# Patient Record
Sex: Male | Born: 1945
Health system: Southern US, Community
[De-identification: ages and names within clinical notes are randomized; demographics above are authoritative.]

## PROBLEM LIST (undated history)

## (undated) DIAGNOSIS — E785 Hyperlipidemia, unspecified: Secondary | ICD-10-CM

## (undated) DIAGNOSIS — K219 Gastro-esophageal reflux disease without esophagitis: Secondary | ICD-10-CM

## (undated) DIAGNOSIS — H409 Unspecified glaucoma: Secondary | ICD-10-CM

## (undated) DIAGNOSIS — T7840XA Allergy, unspecified, initial encounter: Secondary | ICD-10-CM

## (undated) DIAGNOSIS — H269 Unspecified cataract: Secondary | ICD-10-CM

## (undated) DIAGNOSIS — M1990 Unspecified osteoarthritis, unspecified site: Secondary | ICD-10-CM

## (undated) DIAGNOSIS — K648 Other hemorrhoids: Secondary | ICD-10-CM

## (undated) DIAGNOSIS — D369 Benign neoplasm, unspecified site: Secondary | ICD-10-CM

## (undated) DIAGNOSIS — K2270 Barrett's esophagus without dysplasia: Secondary | ICD-10-CM

## (undated) HISTORY — DX: Unspecified cataract: H26.9

## (undated) HISTORY — DX: Allergy, unspecified, initial encounter: T78.40XA

## (undated) HISTORY — DX: Unspecified glaucoma: H40.9

## (undated) HISTORY — DX: Gastro-esophageal reflux disease without esophagitis: K21.9

## (undated) HISTORY — PX: COLONOSCOPY: SHX174

## (undated) HISTORY — DX: Unspecified osteoarthritis, unspecified site: M19.90

## (undated) HISTORY — DX: Benign neoplasm, unspecified site: D36.9

## (undated) HISTORY — PX: HEMORRHOID SURGERY: SHX153

## (undated) HISTORY — PX: EYE SURGERY: SHX253

## (undated) HISTORY — DX: Hyperlipidemia, unspecified: E78.5

## (undated) HISTORY — PX: UPPER GASTROINTESTINAL ENDOSCOPY: SHX188

## (undated) HISTORY — PX: CATARACT EXTRACTION: SUR2

## (undated) HISTORY — PX: KNEE ARTHROSCOPY: SUR90

## (undated) HISTORY — DX: Barrett's esophagus without dysplasia: K22.70

## (undated) HISTORY — DX: Other hemorrhoids: K64.8

## (undated) HISTORY — PX: VASECTOMY: SHX75

---

## 1999-02-28 ENCOUNTER — Ambulatory Visit (HOSPITAL_COMMUNITY): Admission: RE | Admit: 1999-02-28 | Discharge: 1999-02-28 | Payer: Self-pay | Admitting: *Deleted

## 2005-08-03 DIAGNOSIS — K648 Other hemorrhoids: Secondary | ICD-10-CM

## 2005-08-03 HISTORY — DX: Other hemorrhoids: K64.8

## 2005-08-29 ENCOUNTER — Ambulatory Visit (HOSPITAL_COMMUNITY): Admission: RE | Admit: 2005-08-29 | Discharge: 2005-08-29 | Payer: Self-pay | Admitting: *Deleted

## 2006-08-03 DIAGNOSIS — K2270 Barrett's esophagus without dysplasia: Secondary | ICD-10-CM

## 2006-08-03 HISTORY — DX: Barrett's esophagus without dysplasia: K22.70

## 2006-08-06 ENCOUNTER — Ambulatory Visit (HOSPITAL_COMMUNITY): Admission: RE | Admit: 2006-08-06 | Discharge: 2006-08-06 | Payer: Self-pay | Admitting: *Deleted

## 2006-10-07 ENCOUNTER — Encounter: Admission: RE | Admit: 2006-10-07 | Discharge: 2006-10-07 | Payer: Self-pay | Admitting: Specialist

## 2010-08-12 ENCOUNTER — Encounter (INDEPENDENT_AMBULATORY_CARE_PROVIDER_SITE_OTHER): Payer: Self-pay | Admitting: *Deleted

## 2010-08-18 NOTE — Letter (Signed)
Summary: New Patient letter  [facility]  [address] [place], [place] [address]   Phone: [phone]  Fax: [phone]       08/12/2010 MRN: [id number]  Stephen Conway [address] [place], [place]  [address]  Dear Stephen Conway,  Welcome to the Gastroenterology Division at [facility].    You are scheduled to see Dr.  Russella Dar on 09-19-10 at 2:30P.M. on the 3rd floor at [facility], [address]  We ask that you try to arrive at our office 15 minutes prior to your appointment time to allow for check-in.  We would like you to complete the enclosed self-administered evaluation form prior to your visit and bring it with you on the day of your appointment.  We will review it with you.  Also, please bring a complete list of all your medications or, if you prefer, bring the medication bottles and we will list them.  Please bring your insurance card so that we may make a copy of it.  If your insurance requires a referral to see a specialist, please bring your referral form from your primary care physician.  Co-payments are due at the time of your visit and may be paid by cash, check or credit card.     Your office visit will consist of a consult with your physician (includes a physical exam), any laboratory testing he/she may order, scheduling of any necessary diagnostic testing (e.g. x-ray, ultrasound, CT-scan), and scheduling of a procedure (e.g. Endoscopy, Colonoscopy) if required.  Please allow enough time on your schedule to allow for any/all of these possibilities.    If you cannot keep your appointment, please call [phone] to cancel or reschedule prior to your appointment date.  This allows [place] the opportunity to schedule an appointment for another patient in need of care.  If you do not cancel or reschedule by 5 p.m. the business day prior to your appointment date, you will be charged a $50.00 late cancellation/no-show fee.    Thank you for  choosing [facility] for your medical needs.  We appreciate the opportunity to care for you.  Please visit [place] at our website  to learn more about our practice.                     Sincerely,                                                             The Gastroenterology Division

## 2010-09-19 ENCOUNTER — Ambulatory Visit: Payer: Self-pay | Admitting: Gastroenterology

## 2010-09-26 ENCOUNTER — Encounter: Payer: Self-pay | Admitting: Gastroenterology

## 2010-09-26 ENCOUNTER — Ambulatory Visit (INDEPENDENT_AMBULATORY_CARE_PROVIDER_SITE_OTHER): Payer: Medicare Other | Admitting: Gastroenterology

## 2010-09-26 DIAGNOSIS — Z860100 Personal history of colon polyps, unspecified: Secondary | ICD-10-CM | POA: Insufficient documentation

## 2010-09-26 DIAGNOSIS — K2270 Barrett's esophagus without dysplasia: Secondary | ICD-10-CM | POA: Insufficient documentation

## 2010-09-26 NOTE — Assessment & Plan Note (Signed)
Followup endoscopies performed in February 2008 and March 2010.  Barrett's esophagus was not noted on the last endoscopy and he was recommended to follow up in 09/2010. I think it is reasonable to defer a surveillance endoscopy to 3 or 5 years. We will place a recall for March 2013 for surveillance endoscopy and we will decide at that time whether to defer for another 1 or 2 years. He is to continue his antireflux measures and a PPI daily basis.

## 2010-09-26 NOTE — Patient Instructions (Signed)
Your recall Upper Endoscopy has been changed to 09/2011 and your recall Colonoscopy has been changed to 08/2013. We will contact you at that time to schedule your procedures.

## 2010-09-26 NOTE — Assessment & Plan Note (Addendum)
Personal history of adenomatous colon polyps diagnosed February 2007. Colonoscopy March 2010 showed only internal hemorrhoids. Plan for surveillance colonoscopy March 2015.

## 2010-09-26 NOTE — Progress Notes (Signed)
History of Present Illness: This is a  65 year old male previously followed by Dr. Sabino Gasser. He has chronic GERD and was diagnosed with a small area of Barrett's esophagus in February 2007.  He underwent endoscopies in February 2008 and March 2010. His last endoscopy did not show any evidence of Barrett's and no biopsies were obtained. He notes occasional problems with breakthrough acid reflux but his symptoms are generally well controlled. He denies dysphasia, odynophagia and weight loss.   He has prior history of adenomatous colon polyps initially diagnosed in February 2007. Surveillance colonoscopy March 2010 showed only internal hemorrhoids. He has no ongoing colorectal complaints and specifically denies any melena, hematochezia, change in stool caliber or change in bowel habits.    Review of Systems: Razor rash. Pertinent positive and negative review of systems were noted in the above HPI section. All other review of systems were otherwise negative. . Past Medical History  Diagnosis Date  . Barrett's esophagus 08/2006  . Adenomatous polyp   . Internal hemorrhoids 08/2005  . GERD (gastroesophageal reflux disease)   . Hyperlipidemia    Past Surgical History  Procedure Date  . Knee arthroscopy   . Cataract extraction   . Hemorrhoid surgery     reports that he has never smoked. He does not have any smokeless tobacco history on file. He reports that he drinks about 8.4 ounces of alcohol per week. He reports that he does not use illicit drugs. family history includes Diabetes in his father. No Known Allergies    Current Outpatient Prescriptions on File Prior to Visit  Medication Sig Dispense Refill  . aspirin 81 MG tablet Take 81 mg by mouth daily.        . metroNIDAZOLE (METROGEL) 1 % gel Apply topically daily.        . Multiple Vitamin (MULTIVITAMIN) tablet Take 1 tablet by mouth daily.        Marland Kitchen omeprazole (PRILOSEC) 20 MG capsule 20 mg. 4-5 tablets by mouth weekly       .  pravastatin (PRAVACHOL) 40 MG tablet Take 40 mg by mouth daily.        . sildenafil (VIAGRA) 50 MG tablet Take 50 mg by mouth daily as needed.        . zaleplon (SONATA) 10 MG capsule 10 mg. One tablet by mouth once a week        No Known Allergies   Physical Exam: General: Well developed , well nourished, no acute distress Head: Normocephalic and atraumatic Eyes:  sclerae anicteric, EOMI Ears: Normal auditory acuity Mouth: No deformity or lesions Neck: Supple, no masses or thyromegaly Lungs: Clear throughout to auscultation Heart: Regular rate and rhythm; no murmurs, rubs or bruits Abdomen: Soft, non tender and non distended. No masses, hepatosplenomegaly or hernias noted. Normal Bowel sounds Musculoskeletal: Symmetrical with no gross deformities  Skin: No lesions on visible extremities Pulses:  Normal pulses noted Extremities: No clubbing, cyanosis, edema or deformities noted Neurological: Alert oriented x 4, grossly nonfocal Cervical Nodes:  No significant cervical adenopathy Inguinal Nodes: No significant inguinal adenopathy Psychological:  Alert and cooperative. Normal mood and affect

## 2010-09-30 ENCOUNTER — Encounter: Payer: Self-pay | Admitting: Gastroenterology

## 2011-06-13 ENCOUNTER — Other Ambulatory Visit: Payer: Self-pay | Admitting: Surgery

## 2011-09-25 ENCOUNTER — Encounter: Payer: Self-pay | Admitting: Internal Medicine

## 2011-09-25 ENCOUNTER — Ambulatory Visit (INDEPENDENT_AMBULATORY_CARE_PROVIDER_SITE_OTHER): Payer: Medicare Other | Admitting: Internal Medicine

## 2011-09-25 VITALS — BP 144/92 | HR 72 | Temp 97.1°F | Resp 16 | Ht 68.5 in | Wt 199.4 lb

## 2011-09-25 DIAGNOSIS — Z7189 Other specified counseling: Secondary | ICD-10-CM

## 2011-09-25 DIAGNOSIS — K219 Gastro-esophageal reflux disease without esophagitis: Secondary | ICD-10-CM

## 2011-09-25 DIAGNOSIS — Z0000 Encounter for general adult medical examination without abnormal findings: Secondary | ICD-10-CM

## 2011-09-25 DIAGNOSIS — E7889 Other lipoprotein metabolism disorders: Secondary | ICD-10-CM

## 2011-09-25 DIAGNOSIS — Z79899 Other long term (current) drug therapy: Secondary | ICD-10-CM

## 2011-09-25 DIAGNOSIS — N529 Male erectile dysfunction, unspecified: Secondary | ICD-10-CM

## 2011-09-25 DIAGNOSIS — E782 Mixed hyperlipidemia: Secondary | ICD-10-CM

## 2011-09-25 LAB — POCT UA - MICROSCOPIC ONLY
Mucus, UA: NEGATIVE
RBC, urine, microscopic: NEGATIVE

## 2011-09-25 LAB — CBC WITH DIFFERENTIAL/PLATELET
Basophils Relative: 0 % (ref 0–1)
Eosinophils Absolute: 0.1 10*3/uL (ref 0.0–0.7)
HCT: 44 % (ref 39.0–52.0)
Hemoglobin: 14.7 g/dL (ref 13.0–17.0)
Lymphs Abs: 1.3 10*3/uL (ref 0.7–4.0)
MCH: 31.1 pg (ref 26.0–34.0)
MCHC: 33.4 g/dL (ref 30.0–36.0)
MCV: 93 fL (ref 78.0–100.0)
Monocytes Absolute: 0.4 10*3/uL (ref 0.1–1.0)
Monocytes Relative: 9 % (ref 3–12)
RBC: 4.73 MIL/uL (ref 4.22–5.81)

## 2011-09-25 LAB — POCT URINALYSIS DIPSTICK
Bilirubin, UA: NEGATIVE
Glucose, UA: NEGATIVE
Ketones, UA: NEGATIVE
Leukocytes, UA: NEGATIVE
Nitrite, UA: NEGATIVE
pH, UA: 7

## 2011-09-25 LAB — COMPREHENSIVE METABOLIC PANEL
Albumin: 4.3 g/dL (ref 3.5–5.2)
Alkaline Phosphatase: 58 U/L (ref 39–117)
BUN: 12 mg/dL (ref 6–23)
CO2: 31 mEq/L (ref 19–32)
Glucose, Bld: 103 mg/dL — ABNORMAL HIGH (ref 70–99)
Sodium: 141 mEq/L (ref 135–145)
Total Bilirubin: 0.8 mg/dL (ref 0.3–1.2)
Total Protein: 6.4 g/dL (ref 6.0–8.3)

## 2011-09-25 LAB — LIPID PANEL
Cholesterol: 216 mg/dL — ABNORMAL HIGH (ref 0–200)
HDL: 70 mg/dL (ref >39)
LDL Cholesterol: 129 mg/dL — ABNORMAL HIGH (ref 0–99)
Triglycerides: 85 mg/dL (ref <150)

## 2011-09-25 MED ORDER — OMEPRAZOLE 20 MG PO CPDR: 20.0000 mg | DELAYED_RELEASE_CAPSULE | Freq: Every day | ORAL | Status: DC

## 2011-09-25 MED ORDER — PRAVASTATIN SODIUM 40 MG PO TABS: 40.0000 mg | ORAL_TABLET | Freq: Every day | ORAL | Status: DC

## 2011-09-25 MED ORDER — SILDENAFIL CITRATE 50 MG PO TABS: 50.0000 mg | ORAL_TABLET | Freq: Every day | ORAL | Status: DC | PRN

## 2011-09-25 MED ORDER — ZOLPIDEM TARTRATE 10 MG PO TABS: 10.0000 mg | ORAL_TABLET | Freq: Every evening | ORAL | Status: DC | PRN

## 2011-09-25 NOTE — Progress Notes (Signed)
  Subjective:    Patient ID: Stephen Conway, male    DOB: May 02, 1946, 66 y.o.   MRN: [id number]  HPI See scanned hx Feels great Lipids controlled    Review of Systems     Objective:   Physical Exam        Assessment & Plan:

## 2011-09-28 ENCOUNTER — Encounter: Payer: Self-pay | Admitting: Radiology

## 2012-01-01 ENCOUNTER — Telehealth: Payer: Self-pay

## 2012-01-01 DIAGNOSIS — Z0000 Encounter for general adult medical examination without abnormal findings: Secondary | ICD-10-CM

## 2012-01-01 DIAGNOSIS — Z7189 Other specified counseling: Secondary | ICD-10-CM

## 2012-01-01 DIAGNOSIS — N529 Male erectile dysfunction, unspecified: Secondary | ICD-10-CM

## 2012-01-01 DIAGNOSIS — K219 Gastro-esophageal reflux disease without esophagitis: Secondary | ICD-10-CM

## 2012-01-01 DIAGNOSIS — E7889 Other lipoprotein metabolism disorders: Secondary | ICD-10-CM

## 2012-01-01 MED ORDER — ZOLPIDEM TARTRATE 10 MG PO TABS: 10.0000 mg | ORAL_TABLET | Freq: Every evening | ORAL | Status: DC | PRN

## 2012-01-01 NOTE — Telephone Encounter (Signed)
Which meds

## 2012-01-01 NOTE — Telephone Encounter (Signed)
[place] is [place] and will not receive his mail order prescription in time by Friday.  Requesting 10 days, prefers 30 days supply  zolpidem (AMBIEN) 10 MG tablet

## 2012-01-01 NOTE — Telephone Encounter (Signed)
Spoke with patient he needs a refill of he's Ambien generic.  [facility] sent to [facility].

## 2012-01-01 NOTE — Telephone Encounter (Signed)
Done and printed

## 2012-01-02 NOTE — Telephone Encounter (Signed)
Pt notified and rx faxed in 

## 2012-02-27 ENCOUNTER — Other Ambulatory Visit: Payer: Self-pay

## 2012-02-27 MED ORDER — METRONIDAZOLE 1 % EX GEL: Freq: Every day | CUTANEOUS | Status: DC

## 2012-04-24 ENCOUNTER — Encounter: Payer: Self-pay | Admitting: Gastroenterology

## 2012-06-10 ENCOUNTER — Telehealth: Payer: Self-pay

## 2012-06-10 NOTE — Telephone Encounter (Signed)
[facility] called because they rec fax with a big circle around sender and not sure what it meant. States that rx was for Dr. Perrin Maltese. Please call [phone] ref #[id number]

## 2012-06-11 NOTE — Telephone Encounter (Signed)
Called to find out what the question is and they could not determine what the problem was. The rep reported that the only med there is a hold on is pt's zolpidem and there are no RFs on it. I asked her to E-Rx a RF request to [place] and she stated she is sending it now.

## 2012-06-17 ENCOUNTER — Other Ambulatory Visit: Payer: Self-pay

## 2012-06-17 DIAGNOSIS — E7889 Other lipoprotein metabolism disorders: Secondary | ICD-10-CM

## 2012-06-17 DIAGNOSIS — Z0000 Encounter for general adult medical examination without abnormal findings: Secondary | ICD-10-CM

## 2012-06-17 DIAGNOSIS — N529 Male erectile dysfunction, unspecified: Secondary | ICD-10-CM

## 2012-06-17 DIAGNOSIS — Z7189 Other specified counseling: Secondary | ICD-10-CM

## 2012-06-17 DIAGNOSIS — K219 Gastro-esophageal reflux disease without esophagitis: Secondary | ICD-10-CM

## 2012-06-17 DIAGNOSIS — G4700 Insomnia, unspecified: Secondary | ICD-10-CM

## 2012-06-17 NOTE — Telephone Encounter (Signed)
Patient would like a refill on generic Ambien.  He is very disappointed with having to wait over two weeks for refill.  Advised patient I would send message to see if he could receive a refill and apologized for the wait.

## 2012-06-18 ENCOUNTER — Other Ambulatory Visit: Payer: Self-pay | Admitting: Dermatology

## 2012-06-18 MED ORDER — ZOLPIDEM TARTRATE 10 MG PO TABS: 10.0000 mg | ORAL_TABLET | Freq: Every evening | ORAL | Status: DC | PRN

## 2012-06-18 NOTE — Telephone Encounter (Signed)
Called patient to advise. Apologized again for the wait.

## 2012-06-18 NOTE — Telephone Encounter (Signed)
Needs OFFICE VISIT FOR MORE.  

## 2012-07-22 ENCOUNTER — Encounter: Payer: Self-pay | Admitting: Radiology

## 2012-07-22 ENCOUNTER — Telehealth: Payer: Self-pay | Admitting: Radiology

## 2012-07-22 NOTE — Telephone Encounter (Signed)
Faxed med list to [facility], per their request, pt there now.

## 2012-09-03 ENCOUNTER — Telehealth: Payer: Self-pay

## 2012-09-03 DIAGNOSIS — Z7189 Other specified counseling: Secondary | ICD-10-CM

## 2012-09-03 DIAGNOSIS — N529 Male erectile dysfunction, unspecified: Secondary | ICD-10-CM

## 2012-09-03 DIAGNOSIS — E7889 Other lipoprotein metabolism disorders: Secondary | ICD-10-CM

## 2012-09-03 DIAGNOSIS — Z0000 Encounter for general adult medical examination without abnormal findings: Secondary | ICD-10-CM

## 2012-09-03 DIAGNOSIS — K219 Gastro-esophageal reflux disease without esophagitis: Secondary | ICD-10-CM

## 2012-09-03 NOTE — Telephone Encounter (Signed)
PT STATES HIS [facility] STATED WE HAD TO CALL THEM TO AUTHORIZED HIS ZOLPIDEM AND OMEPRAZOLE. HAVE AN APPT THE END OF THIS MONTH PLEASE CALL [phone]     [facility]

## 2012-09-04 MED ORDER — OMEPRAZOLE 20 MG PO CPDR: 20.0000 mg | DELAYED_RELEASE_CAPSULE | Freq: Every day | ORAL | Status: DC

## 2012-09-04 NOTE — Telephone Encounter (Signed)
I have refilled omperazole.  Will forward to Dr. Perrin Maltese to advise on [place] as it is a controlled substance

## 2012-09-04 NOTE — Telephone Encounter (Signed)
Please advise on refill of Ambien and Omeprazole

## 2012-09-05 NOTE — Telephone Encounter (Signed)
I have advised him the Omeprazole was sent in for him and message for Ambien was sent to Dr Perrin Maltese.

## 2012-09-30 ENCOUNTER — Encounter: Payer: Medicare Other | Admitting: Internal Medicine

## 2012-10-07 ENCOUNTER — Telehealth: Payer: Self-pay

## 2012-10-07 DIAGNOSIS — K219 Gastro-esophageal reflux disease without esophagitis: Secondary | ICD-10-CM

## 2012-10-07 DIAGNOSIS — N529 Male erectile dysfunction, unspecified: Secondary | ICD-10-CM

## 2012-10-07 DIAGNOSIS — E7889 Other lipoprotein metabolism disorders: Secondary | ICD-10-CM

## 2012-10-07 DIAGNOSIS — Z0000 Encounter for general adult medical examination without abnormal findings: Secondary | ICD-10-CM

## 2012-10-07 DIAGNOSIS — Z7189 Other specified counseling: Secondary | ICD-10-CM

## 2012-10-07 MED ORDER — PRAVASTATIN SODIUM 40 MG PO TABS: 40.0000 mg | ORAL_TABLET | Freq: Every day | ORAL | Status: DC

## 2012-10-07 NOTE — Telephone Encounter (Signed)
Received request for pravastatin 40 mg to be sent to [facility] [place] [facility] of [facility]. I have resent the Rx as written by Dr Perrin Maltese.

## 2012-10-08 ENCOUNTER — Telehealth: Payer: Self-pay

## 2012-10-08 DIAGNOSIS — E7889 Other lipoprotein metabolism disorders: Secondary | ICD-10-CM

## 2012-10-08 DIAGNOSIS — G4700 Insomnia, unspecified: Secondary | ICD-10-CM

## 2012-10-08 DIAGNOSIS — K219 Gastro-esophageal reflux disease without esophagitis: Secondary | ICD-10-CM

## 2012-10-08 DIAGNOSIS — Z0000 Encounter for general adult medical examination without abnormal findings: Secondary | ICD-10-CM

## 2012-10-08 DIAGNOSIS — Z7189 Other specified counseling: Secondary | ICD-10-CM

## 2012-10-08 DIAGNOSIS — N529 Male erectile dysfunction, unspecified: Secondary | ICD-10-CM

## 2012-10-08 NOTE — Telephone Encounter (Signed)
Patient called back stating that he would like a 90 day supply of the following medications:Ambien (generic), Pravastatin and Omeprazole. Please call him on his cell if there are any further questions [phone]. Thanks

## 2012-10-08 NOTE — Telephone Encounter (Signed)
Left message for him to call me back to advise what medication he is needing

## 2012-10-08 NOTE — Telephone Encounter (Signed)
[facility] [address]  Requesting med refills  Frustrated b/c he has to call every 90 days for refills and they are not filled without numerous calls and requests - [facility]   Has an appointment in August with Dr. Perrin Maltese.  He would be willing to come in and see anyone with an appointment   Wants 90 day supply.  [phone]

## 2012-10-10 ENCOUNTER — Other Ambulatory Visit: Payer: Self-pay | Admitting: Physician Assistant

## 2012-10-10 NOTE — Telephone Encounter (Signed)
Dr Perrin Maltese, see Pt's request for 90 day RFs of 3 meds. You last saw him over a year ago in March 2013, and he has an appt scheduled but not until Aug. Do you want to OK these RFs for 90 days?

## 2012-10-11 ENCOUNTER — Other Ambulatory Visit: Payer: Self-pay

## 2012-10-11 DIAGNOSIS — N529 Male erectile dysfunction, unspecified: Secondary | ICD-10-CM

## 2012-10-11 DIAGNOSIS — G4700 Insomnia, unspecified: Secondary | ICD-10-CM

## 2012-10-11 DIAGNOSIS — E7889 Other lipoprotein metabolism disorders: Secondary | ICD-10-CM

## 2012-10-11 DIAGNOSIS — Z7189 Other specified counseling: Secondary | ICD-10-CM

## 2012-10-11 DIAGNOSIS — K219 Gastro-esophageal reflux disease without esophagitis: Secondary | ICD-10-CM

## 2012-10-11 DIAGNOSIS — Z0000 Encounter for general adult medical examination without abnormal findings: Secondary | ICD-10-CM

## 2012-10-11 MED ORDER — OMEPRAZOLE 20 MG PO CPDR: 20.0000 mg | DELAYED_RELEASE_CAPSULE | Freq: Every day | ORAL | Status: DC

## 2012-10-11 MED ORDER — PRAVASTATIN SODIUM 40 MG PO TABS: 40.0000 mg | ORAL_TABLET | Freq: Every day | ORAL | Status: DC

## 2012-10-11 MED ORDER — ZOLPIDEM TARTRATE 10 MG PO TABS: 10.0000 mg | ORAL_TABLET | Freq: Every evening | ORAL | Status: DC | PRN

## 2012-10-11 NOTE — Telephone Encounter (Signed)
Pt needs OV, last seen March 2013

## 2012-10-11 NOTE — Telephone Encounter (Signed)
Got all three Rxs sent [facility] and notified pt they were sent. Pt thanked [place].

## 2012-10-11 NOTE — Telephone Encounter (Signed)
OK to refill for 6 months 

## 2012-10-11 NOTE — Telephone Encounter (Signed)
Sent in

## 2013-02-03 ENCOUNTER — Ambulatory Visit (INDEPENDENT_AMBULATORY_CARE_PROVIDER_SITE_OTHER): Payer: Medicare Other | Admitting: Internal Medicine

## 2013-02-03 ENCOUNTER — Encounter: Payer: Self-pay | Admitting: Internal Medicine

## 2013-02-03 VITALS — BP 130/78 | HR 72 | Temp 98.4°F | Resp 16 | Ht 68.5 in | Wt 199.2 lb

## 2013-02-03 DIAGNOSIS — Z79899 Other long term (current) drug therapy: Secondary | ICD-10-CM

## 2013-02-03 DIAGNOSIS — Z125 Encounter for screening for malignant neoplasm of prostate: Secondary | ICD-10-CM

## 2013-02-03 DIAGNOSIS — Z0000 Encounter for general adult medical examination without abnormal findings: Secondary | ICD-10-CM

## 2013-02-03 DIAGNOSIS — E785 Hyperlipidemia, unspecified: Secondary | ICD-10-CM

## 2013-02-03 DIAGNOSIS — N529 Male erectile dysfunction, unspecified: Secondary | ICD-10-CM

## 2013-02-03 DIAGNOSIS — E7889 Other lipoprotein metabolism disorders: Secondary | ICD-10-CM

## 2013-02-03 DIAGNOSIS — G4700 Insomnia, unspecified: Secondary | ICD-10-CM

## 2013-02-03 DIAGNOSIS — Z7189 Other specified counseling: Secondary | ICD-10-CM

## 2013-02-03 DIAGNOSIS — K219 Gastro-esophageal reflux disease without esophagitis: Secondary | ICD-10-CM

## 2013-02-03 LAB — LIPID PANEL
Cholesterol: 220 mg/dL — ABNORMAL HIGH (ref 0–200)
VLDL: 20 mg/dL (ref 0–40)

## 2013-02-03 LAB — COMPREHENSIVE METABOLIC PANEL
ALT: 20 U/L (ref 0–53)
Albumin: 4.4 g/dL (ref 3.5–5.2)
Alkaline Phosphatase: 55 U/L (ref 39–117)
CO2: 29 mEq/L (ref 19–32)
Glucose, Bld: 96 mg/dL (ref 70–99)
Potassium: 4.7 mEq/L (ref 3.5–5.3)
Sodium: 135 mEq/L (ref 135–145)
Total Protein: 6.7 g/dL (ref 6.0–8.3)

## 2013-02-03 LAB — POCT UA - MICROSCOPIC ONLY
Bacteria, U Microscopic: NEGATIVE
Casts, Ur, LPF, POC: NEGATIVE
Crystals, Ur, HPF, POC: NEGATIVE
Mucus, UA: NEGATIVE

## 2013-02-03 LAB — POCT URINALYSIS DIPSTICK
Bilirubin, UA: NEGATIVE
Blood, UA: NEGATIVE
Glucose, UA: NEGATIVE
Spec Grav, UA: 1.02
pH, UA: 5.5

## 2013-02-03 MED ORDER — SILDENAFIL CITRATE 50 MG PO TABS: 50.0000 mg | ORAL_TABLET | Freq: Every day | ORAL | Status: DC | PRN

## 2013-02-03 NOTE — Progress Notes (Signed)
  Subjective:    Patient ID: Stephen Conway, male    DOB: [birth year], 67 y.o.   MRN: [id number]  HPI  Doing well. Exercises regularly, active , travels a lot. Tolerates his meds No complaints. UTD on colonoscopy and EGD  Review of Systems  Constitutional: Negative.   HENT: Negative.   Eyes: Negative.   Cardiovascular: Negative.   Endocrine: Negative.   Genitourinary: Negative.   Musculoskeletal: Positive for arthralgias.  Skin: Negative.   Allergic/Immunologic: Negative.   Neurological: Negative.   Hematological: Negative.   Psychiatric/Behavioral: Negative.        Objective:   Physical Exam  Vitals reviewed. Constitutional: He is oriented to person, place, and time. He appears well-developed and well-nourished.  HENT:  Right Ear: External ear normal.  Left Ear: External ear normal.  Nose: Nose normal.  Mouth/Throat: Oropharynx is clear and moist.  Eyes: Conjunctivae and EOM are normal. Pupils are equal, round, and reactive to light.  Neck: Normal range of motion. Neck supple. No tracheal deviation present. No thyromegaly present.  Cardiovascular: Normal rate, regular rhythm, normal heart sounds and intact distal pulses.   Pulmonary/Chest: Effort normal and breath sounds normal.  Abdominal: Soft. Bowel sounds are normal. There is no tenderness.  Genitourinary: Rectum normal, prostate normal and penis normal.  Musculoskeletal: Normal range of motion.  Neurological: He is alert and oriented to person, place, and time. He has normal reflexes. No cranial nerve deficit. Coordination normal.  Skin: Skin is warm and dry. No rash noted.  Psychiatric: He has a normal mood and affect. His behavior is normal.          Assessment & Plan:  Healthy/Lipids controlled 1 year

## 2013-02-03 NOTE — Patient Instructions (Addendum)
Barrett's Esophagus Barrett's esophagus occurs when the lining of the esophagus is damaged. The esophagus is the tube that carries food from the mouth to the stomach. With Barrett's esophagus, the lining of the esophagus gets replaced by material that is similar to the lining in the intestines. This process is called intestinal metaplasia. A small number of people with Barrett's esophagus develop esophageal cancer. CAUSES  The exact cause of Barrett's esophagus is unknown. SYMPTOMS  Most people with Barrett's esophagus do not have symptoms. However, many patients also have gastroesophageal reflux disease (GERD). GERD can cause heartburn, trouble swallowing, and a dry cough. DIAGNOSIS Barrett's esophagus is diagnosed by an exam called upper gastrointestinal endoscopy. A thin, flexible tube (endoscope) is passed down the esophagus. The endoscope has a light and camera on the end. Your caregiver uses the endoscope to view the inside of the esophagus. A tissue sample may also be taken and examined under a microscope (biopsy). If cancer cells are found during the biopsy, this condition is called dysplasia. TREATMENT  If you have no dysplasia or low-grade dysplasia, your caregiver may recommend no treatment or only taking medicines to treat GERD. Sometimes, taking acid-blocking drugs to treat GERD helps improve the tissue affected by Barrett's esophagus. Your caregiver may also recommend periodic esophageal exams. If you have high-grade dysplasia, treatment may include removing the damaged parts of the esophagus. This can be done by heating, freezing, or surgically removing the tissue. In some cases, surgery may be done to remove most of the esophagus. The stomach is then attached to the remaining portion of the esophagus. HOME CARE INSTRUCTIONS  Take acid-blocking drugs for GERD if recommended by your caregiver.  Keep all follow-up appointments as directed by your caregiver. You may need periodic  esophageal exams. SEEK IMMEDIATE MEDICAL CARE IF:  You have chest pain.  You have trouble swallowing.  You vomit blood or material that looks like coffee grounds.  Your stools are bright red or dark. Document Released: 09/09/2003 Document Revised: 12/19/2011 Document Reviewed: 08/29/2011 [facility] Patient Information 2014 [place], [place]. Gastroesophageal Reflux Disease, Adult Gastroesophageal reflux disease (GERD) happens when acid from your stomach flows up into the esophagus. When acid comes in contact with the esophagus, the acid causes soreness (inflammation) in the esophagus. Over time, GERD may create small holes (ulcers) in the lining of the esophagus. CAUSES   Increased body weight. This puts pressure on the stomach, making acid rise from the stomach into the esophagus.  Smoking. This increases acid production in the stomach.  Drinking alcohol. This causes decreased pressure in the lower esophageal sphincter (valve or ring of muscle between the esophagus and stomach), allowing acid from the stomach into the esophagus.  Late evening meals and a full stomach. This increases pressure and acid production in the stomach.  A malformed lower esophageal sphincter. Sometimes, no cause is found. SYMPTOMS   Burning pain in the lower part of the mid-chest behind the breastbone and in the mid-stomach area. This may occur twice a week or more often.  Trouble swallowing.  Sore throat.  Dry cough.  Asthma-like symptoms including chest tightness, shortness of breath, or wheezing. DIAGNOSIS  Your caregiver may be able to diagnose GERD based on your symptoms. In some cases, X-rays and other tests may be done to check for complications or to check the condition of your stomach and esophagus. TREATMENT  Your caregiver may recommend over-the-counter or prescription medicines to help decrease acid production. Ask your caregiver before starting or adding any  new medicines.  HOME CARE  INSTRUCTIONS   Change the factors that you can control. Ask your caregiver for guidance concerning weight loss, quitting smoking, and alcohol consumption.  Avoid foods and drinks that make your symptoms worse, such as:  Caffeine or alcoholic drinks.  Chocolate.  Peppermint or mint flavorings.  Garlic and onions.  Spicy foods.  Citrus fruits, such as oranges, lemons, or limes.  Tomato-based foods such as sauce, chili, salsa, and pizza.  Fried and fatty foods.  Avoid lying down for the 3 hours prior to your bedtime or prior to taking a nap.  Eat small, frequent meals instead of large meals.  Wear loose-fitting clothing. Do not wear anything tight around your waist that causes pressure on your stomach.  Raise the head of your bed 6 to 8 inches with wood blocks to help you sleep. Extra pillows will not help.  Only take over-the-counter or prescription medicines for pain, discomfort, or fever as directed by your caregiver.  Do not take aspirin, ibuprofen, or other nonsteroidal anti-inflammatory drugs (NSAIDs). SEEK IMMEDIATE MEDICAL CARE IF:   You have pain in your arms, neck, jaw, teeth, or back.  Your pain increases or changes in intensity or duration.  You develop nausea, vomiting, or sweating (diaphoresis).  You develop shortness of breath, or you faint.  Your vomit is green, yellow, black, or looks like coffee grounds or blood.  Your stool is red, bloody, or black. These symptoms could be signs of other problems, such as heart disease, gastric bleeding, or esophageal bleeding. MAKE SURE YOU:   Understand these instructions.  Will watch your condition.  Will get help right away if you are not doing well or get worse. Document Released: 03/29/2005 Document Revised: 09/11/2011 Document Reviewed: 01/06/2011 [facility] Patient Information 2014 [place], [place].

## 2013-02-04 LAB — CBC WITH DIFFERENTIAL/PLATELET
HCT: 44.4 % (ref 39.0–52.0)
Lymphs Abs: 1.3 10*3/uL (ref 0.7–4.0)
MCH: 31.6 pg (ref 26.0–34.0)
MCV: 90.1 fL (ref 78.0–100.0)
Monocytes Relative: 10 % (ref 3–12)
Neutro Abs: 2.3 10*3/uL (ref 1.7–7.7)
Neutrophils Relative %: 54 % (ref 43–77)

## 2013-02-04 LAB — TSH: TSH: 1.643 u[IU]/mL (ref 0.350–4.500)

## 2013-02-04 LAB — PSA, MEDICARE: PSA: 0.55 ng/mL (ref <=4.00)

## 2013-03-13 ENCOUNTER — Other Ambulatory Visit: Payer: Self-pay | Admitting: *Deleted

## 2013-03-13 DIAGNOSIS — Z0000 Encounter for general adult medical examination without abnormal findings: Secondary | ICD-10-CM

## 2013-03-13 DIAGNOSIS — Z7189 Other specified counseling: Secondary | ICD-10-CM

## 2013-03-13 DIAGNOSIS — E7889 Other lipoprotein metabolism disorders: Secondary | ICD-10-CM

## 2013-03-13 DIAGNOSIS — N529 Male erectile dysfunction, unspecified: Secondary | ICD-10-CM

## 2013-03-13 DIAGNOSIS — K219 Gastro-esophageal reflux disease without esophagitis: Secondary | ICD-10-CM

## 2013-03-13 NOTE — Telephone Encounter (Signed)
Pt saw you in August for CPE and you stated that his cholesterol was a bit high and consider to switch to Lipitor.  We sent a message to switch and you never responded. Now pharmacy is requesting new rx for the Lipitor.  Can you send in rx for Lipitor to pharmacy for pt?

## 2013-03-16 NOTE — Telephone Encounter (Signed)
Lipitor 20mg  qd 90 tabs 3 rfs

## 2013-03-17 MED ORDER — ATORVASTATIN CALCIUM 20 MG PO TABS: 20.0000 mg | ORAL_TABLET | Freq: Every day | ORAL | Status: DC

## 2013-03-17 NOTE — Telephone Encounter (Signed)
rx sent in for lipitor.  

## 2013-03-17 NOTE — Telephone Encounter (Signed)
Was previously on Pravachol, do you want to renew this instead?  pended please advise.

## 2013-04-30 ENCOUNTER — Other Ambulatory Visit: Payer: Self-pay | Admitting: Internal Medicine

## 2013-05-01 NOTE — Telephone Encounter (Signed)
Ok to rf with one rf

## 2013-05-02 MED ORDER — ZOLPIDEM TARTRATE 10 MG PO TABS: ORAL_TABLET | ORAL | Status: DC

## 2013-05-02 NOTE — Addendum Note (Signed)
Addended by: Sheppard Plumber A on: 05/02/2013 01:18 PM   Modules accepted: Orders

## 2013-05-02 NOTE — Telephone Encounter (Signed)
Printed and faxed

## 2013-06-02 ENCOUNTER — Other Ambulatory Visit: Payer: Self-pay | Admitting: Internal Medicine

## 2013-07-03 HISTORY — PX: COLONOSCOPY: SHX174

## 2013-07-03 HISTORY — PX: UPPER GASTROINTESTINAL ENDOSCOPY: SHX188

## 2013-07-29 ENCOUNTER — Encounter: Payer: Self-pay | Admitting: Gastroenterology

## 2013-08-30 ENCOUNTER — Other Ambulatory Visit: Payer: Self-pay | Admitting: Physician Assistant

## 2013-10-13 ENCOUNTER — Encounter: Payer: Self-pay | Admitting: Gastroenterology

## 2013-10-20 ENCOUNTER — Ambulatory Visit: Payer: Medicare Other | Admitting: Gastroenterology

## 2013-11-17 ENCOUNTER — Telehealth: Payer: Self-pay

## 2013-11-17 NOTE — Telephone Encounter (Signed)
Pt called wanting to know if he could change his prescription of 50mg  of Viagra to 100mg  so he can split them in half to save on cost, said the medication is becoming too expensive for him.[phone]

## 2013-11-18 NOTE — Telephone Encounter (Signed)
Dr Guest please advise. 

## 2013-11-19 ENCOUNTER — Ambulatory Visit (AMBULATORY_SURGERY_CENTER): Payer: Self-pay | Admitting: *Deleted

## 2013-11-19 VITALS — Ht 70.0 in | Wt 204.2 lb

## 2013-11-19 DIAGNOSIS — Z86010 Personal history of colonic polyps: Secondary | ICD-10-CM

## 2013-11-19 DIAGNOSIS — K2270 Barrett's esophagus without dysplasia: Secondary | ICD-10-CM

## 2013-11-19 MED ORDER — MOVIPREP 100 G PO SOLR: ORAL | Status: DC

## 2013-11-19 NOTE — Progress Notes (Signed)
Patient denies any allergies to eggs or soy. Patient denies any problems with anesthesia/sedation. Patient denies any oxygen use at home and does not take any diet/weight loss medications. EMMI education assisgned to patient on colonoscopy & EGD, this was explained and instructions given to patient. 

## 2013-11-20 MED ORDER — SILDENAFIL CITRATE 100 MG PO TABS: 100.0000 mg | ORAL_TABLET | Freq: Every day | ORAL | Status: DC | PRN

## 2013-11-20 NOTE — Telephone Encounter (Signed)
Prescribe viagra 100mg  number 18 one po qd prn ed

## 2013-11-20 NOTE — Telephone Encounter (Signed)
Sent in Rx and notified pt. 

## 2013-11-25 ENCOUNTER — Other Ambulatory Visit: Payer: Self-pay | Admitting: Physician Assistant

## 2013-11-28 ENCOUNTER — Encounter: Payer: Self-pay | Admitting: Gastroenterology

## 2013-12-03 ENCOUNTER — Encounter: Payer: Medicare Other | Admitting: Gastroenterology

## 2014-01-15 ENCOUNTER — Ambulatory Visit (AMBULATORY_SURGERY_CENTER): Payer: Medicare Other | Admitting: Gastroenterology

## 2014-01-15 ENCOUNTER — Encounter: Payer: Self-pay | Admitting: Gastroenterology

## 2014-01-15 VITALS — BP 127/78 | HR 66 | Temp 96.9°F | Resp 18 | Ht 70.0 in | Wt 195.0 lb

## 2014-01-15 DIAGNOSIS — K2990 Gastroduodenitis, unspecified, without bleeding: Secondary | ICD-10-CM

## 2014-01-15 DIAGNOSIS — D126 Benign neoplasm of colon, unspecified: Secondary | ICD-10-CM

## 2014-01-15 DIAGNOSIS — K2270 Barrett's esophagus without dysplasia: Secondary | ICD-10-CM

## 2014-01-15 DIAGNOSIS — K2970 Gastritis, unspecified, without bleeding: Secondary | ICD-10-CM

## 2014-01-15 DIAGNOSIS — D131 Benign neoplasm of stomach: Secondary | ICD-10-CM

## 2014-01-15 DIAGNOSIS — Z86010 Personal history of colonic polyps: Secondary | ICD-10-CM

## 2014-01-15 MED ORDER — OMEPRAZOLE 20 MG PO CPDR: 20.0000 mg | DELAYED_RELEASE_CAPSULE | Freq: Every day | ORAL | Status: DC

## 2014-01-15 MED ORDER — SODIUM CHLORIDE 0.9 % IV SOLN: 500.0000 mL | INTRAVENOUS | Status: DC

## 2014-01-15 MED ORDER — OMEPRAZOLE 20 MG PO CPDR
20.0000 mg | DELAYED_RELEASE_CAPSULE | Freq: Every day | ORAL | Status: DC
Start: 2014-01-15 — End: 2015-01-19

## 2014-01-15 NOTE — Op Note (Signed)
[place]  [address] [place], [address]   COLONOSCOPY PROCEDURE REPORT  PATIENT: Stephen, Conway  MR#: [id number] BIRTHDATE: 16-Jan-1946 , 15  yrs. old GENDER: Male ENDOSCOPIST: Ladene Artist, MD, [facility] PROCEDURE DATE:  01/15/2014 PROCEDURE:   Colonoscopy with biopsy and snare polypectomy First Screening Colonoscopy - Avg.  risk and is 50 yrs.  old or older - No.  Prior Negative Screening - Now for repeat screening. N/A  History of Adenoma - Now for follow-up colonoscopy & has been > or = to 3 yrs.  Yes hx of adenoma.  Has been 3 or more years since last colonoscopy.  Polyps Removed Today? Yes. ASA CLASS:   Class II INDICATIONS:Patient's personal history of adenomatous colon polyps.  MEDICATIONS: MAC sedation, administered by CRNA and propofol (Diprivan) 280mg  IV DESCRIPTION OF PROCEDURE:   After the risks benefits and alternatives of the procedure were thoroughly explained, informed consent was obtained.  A digital rectal exam revealed no abnormalities of the rectum.   The LB [id number]  endoscope was introduced through the anus and advanced to the cecum, which was identified by both the appendix and ileocecal valve. No adverse events experienced.   The quality of the prep was good, using MoviPrep  The instrument was then slowly withdrawn as the colon was fully examined.  COLON FINDINGS: A sessile polyp measuring 5 mm in size was found in the transverse colon.  A polypectomy was performed with a cold snare.  The resection was complete and the polyp tissue was completely retrieved.   A sessile polyp measuring 5 mm in size was found in the sigmoid colon.  A polypectomy was performed with cold forceps.  The resection was complete and the polyp tissue was completely retrieved.   The colon was otherwise normal.  There was no diverticulosis, inflammation, polyps or cancers unless previously stated.  Retroflexed views revealed moderate  internal hemorrhoids. The time to cecum=2 minutes 08 seconds.  Withdrawal time=13 minutes 44 seconds.  The scope was withdrawn and the procedure completed.  COMPLICATIONS: There were no complications.  ENDOSCOPIC IMPRESSION: 1.   Sessile polyp measuring 5 mm in the transverse colon; polypectomy performed with a cold snare 2.   Sessile polyp measuring 5 mm in the sigmoid colon; polypectomy performed with cold forceps 3.   Moderate internal hemorrhoids  RECOMMENDATIONS: 1.  Await pathology results 2.  Repeat Colonoscopy in 5 years.  eSigned:  Ladene Artist, MD, [facility] 01/15/2014 10:04 AM

## 2014-01-15 NOTE — Patient Instructions (Addendum)
YOU HAD AN ENDOSCOPIC PROCEDURE TODAY AT THE [facility]: Refer to the procedure report that was given to you for any specific questions about what was found during the examination.  If the procedure report does not answer your questions, please call your gastroenterologist to clarify.  If you requested that your care partner not be given the details of your procedure findings, then the procedure report has been included in a sealed envelope for you to review at your convenience later.  YOU SHOULD EXPECT: Some feelings of bloating in the abdomen. Passage of more gas than usual.  Walking can help get rid of the air that was put into your GI tract during the procedure and reduce the bloating. If you had a lower endoscopy (such as a colonoscopy or flexible sigmoidoscopy) you may notice spotting of blood in your stool or on the toilet paper. If you underwent a bowel prep for your procedure, then you may not have a normal bowel movement for a few days.  DIET: Your first meal following the procedure should be a light meal and then it is ok to progress to your normal diet.  A half-sandwich or bowl of soup is an example of a good first meal.  Heavy or fried foods are harder to digest and may make you feel nauseous or bloated.  Likewise meals heavy in dairy and vegetables can cause extra gas to form and this can also increase the bloating.  Drink plenty of fluids but you should avoid alcoholic beverages for 24 hours.  ACTIVITY: Your care partner should take you home directly after the procedure.  You should plan to take it easy, moving slowly for the rest of the day.  You can resume normal activity the day after the procedure however you should NOT DRIVE or use heavy machinery for 24 hours (because of the sedation medicines used during the test).    SYMPTOMS TO REPORT IMMEDIATELY: A gastroenterologist can be reached at any hour.  During normal business hours, 8:30 AM to 5:00 PM Monday through Friday,  call [phone].  After hours and on weekends, please call the GI answering service at [phone] who will take a message and have the physician on call contact you.   Following lower endoscopy (colonoscopy or flexible sigmoidoscopy):  Excessive amounts of blood in the stool  Significant tenderness or worsening of abdominal pains  Swelling of the abdomen that is new, acute  Fever of 100F or higher  Following upper endoscopy (EGD)  Vomiting of blood or coffee ground material  New chest pain or pain under the shoulder blades  Painful or persistently difficult swallowing  New shortness of breath  Fever of 100F or higher  Black, tarry-looking stools  FOLLOW UP: If any biopsies were taken you will be contacted by phone or by letter within the next 1-3 weeks.  Call your gastroenterologist if you have not heard about the biopsies in 3 weeks.  Our staff will call the home number listed on your records the next business day following your procedure to check on you and address any questions or concerns that you may have at that time regarding the information given to you following your procedure. This is a courtesy call and so if there is no answer at the home number and we have not heard from you through the emergency physician on call, we will assume that you have returned to your regular daily activities without incident.  SIGNATURES/CONFIDENTIALITY: You and/or your care   partner have signed paperwork which will be entered into your electronic medical record.  These signatures attest to the fact that that the information above on your After Visit Summary has been reviewed and is understood.  Full responsibility of the confidentiality of this discharge information lies with you and/or your care-partner.   INFORMATION ON POLYPS AND HEMORRHOIDS GIVEN TO YOU TODAY  INFORMATION ON GASTRITIS AND ANTI REFLUX INFORMATION GIVEN TO YOU TODAY  CONTINUE ANTI REFLUX MEDICATION , RX SENT TO YOUR  PHARMACY [facility] WITH 11 REFILLS

## 2014-01-15 NOTE — Progress Notes (Signed)
Called to room to assist during endoscopic procedure.  Patient ID and intended procedure confirmed with present staff. Received instructions for my participation in the procedure from the performing physician.  

## 2014-01-15 NOTE — Progress Notes (Signed)
A/ox3 pleased with MAC, report to Penny RN 

## 2014-01-15 NOTE — Op Note (Signed)
[facility]  [address] [place], [id number]   ENDOSCOPY PROCEDURE REPORT  PATIENT: Stephen, Conway  MR#: [id number] BIRTHDATE: 04-23-1946 , 68  yrs. old GENDER: Male ENDOSCOPIST: Ladene Artist, MD, [facility] PROCEDURE DATE:  01/15/2014 PROCEDURE:  EGD w/ biopsy ASA CLASS:     Class II INDICATIONS:  history of Barrett's esophagus.   History of esophageal reflux. MEDICATIONS: MAC sedation, administered by CRNA, There was residual sedation effect present from prior procedure, and propofol (Diprivan) 120mg  IV TOPICAL ANESTHETIC: none DESCRIPTION OF PROCEDURE: After the risks benefits and alternatives of the procedure were thoroughly explained, informed consent was obtained.  The LB [id number] endoscope was introduced through the mouth and advanced to the second portion of the duodenum. Without limitations.  The instrument was slowly withdrawn as the mucosa was fully examined.    ESOPHAGUS: A minimally variable Z-line was observed 40 cm from the incisors. No clear endoscopic evidence of Barrett's. Multiple biopsies were performed given prior history of Barrett's.   The esophagus was otherwise normal. STOMACH: Mild non-erosive gastritis was found in the gastric body and gastric antrum.  Multiple biopsies were performed.  A few sessile polyps were found in the gastric body measuring 3-4 mm c/x benign fundic gland polyps.  Multiple biopsies was performed.   The stomach otherwise appeared normal. DUODENUM: The duodenal mucosa showed no abnormalities in the bulb and second portion of the duodenum.  Retroflexed views revealed no abnormalities.   The scope was then withdrawn from the patient and the procedure completed.  COMPLICATIONS: There were no complications.  ENDOSCOPIC IMPRESSION: 1.   Minimally variable Z-line at 40 cm from the incisors; multiple biopsies 2.   Non-erosive gastritis in the gastric body and gastric antrum; multiple biopsies 3.    Few sessile polyps were found in the gastric body  RECOMMENDATIONS: 1.  Await pathology results. If Barrett's is not idenified on biopsy will discontinue Barrett's surveillance 2.  Anti-reflux regimen 3.  Continue PPI  eSigned:  Ladene Artist, MD, [facility] 01/15/2014 10:20 AM

## 2014-01-16 ENCOUNTER — Telehealth: Payer: Self-pay | Admitting: *Deleted

## 2014-01-16 NOTE — Telephone Encounter (Signed)
  Follow up Call-  Call back number 01/15/2014  Post procedure Call Back phone  # [phone]  Permission to leave phone message Yes     Patient questions:  Do you have a fever, pain , or abdominal swelling? No. Pain Score  0 *  Have you tolerated food without any problems? Yes.    Have you been able to return to your normal activities? Yes.    Do you have any questions about your discharge instructions: Diet   No. Medications  No. Follow up visit  No.  Do you have questions or concerns about your Care? No.  Actions: * If pain score is 4 or above: No action needed, pain <4.

## 2014-01-19 ENCOUNTER — Ambulatory Visit (INDEPENDENT_AMBULATORY_CARE_PROVIDER_SITE_OTHER): Payer: Medicare Other | Admitting: Family Medicine

## 2014-01-19 ENCOUNTER — Encounter: Payer: Self-pay | Admitting: Family Medicine

## 2014-01-19 VITALS — BP 140/92 | HR 77 | Temp 97.8°F | Resp 16 | Ht 68.5 in | Wt 200.6 lb

## 2014-01-19 DIAGNOSIS — L719 Rosacea, unspecified: Secondary | ICD-10-CM

## 2014-01-19 DIAGNOSIS — I493 Ventricular premature depolarization: Secondary | ICD-10-CM

## 2014-01-19 DIAGNOSIS — Z125 Encounter for screening for malignant neoplasm of prostate: Secondary | ICD-10-CM

## 2014-01-19 DIAGNOSIS — E785 Hyperlipidemia, unspecified: Secondary | ICD-10-CM

## 2014-01-19 DIAGNOSIS — I4949 Other premature depolarization: Secondary | ICD-10-CM

## 2014-01-19 DIAGNOSIS — Z23 Encounter for immunization: Secondary | ICD-10-CM

## 2014-01-19 DIAGNOSIS — Z0000 Encounter for general adult medical examination without abnormal findings: Secondary | ICD-10-CM

## 2014-01-19 LAB — CBC WITH DIFFERENTIAL/PLATELET
Basophils Absolute: 0 10*3/uL (ref 0.0–0.1)
Basophils Relative: 0 % (ref 0–1)
EOS ABS: 0.3 10*3/uL (ref 0.0–0.7)
EOS PCT: 7 % — AB (ref 0–5)
HCT: 43.4 % (ref 39.0–52.0)
HEMOGLOBIN: 15.5 g/dL (ref 13.0–17.0)
LYMPHS PCT: 31 % (ref 12–46)
Lymphs Abs: 1.5 10*3/uL (ref 0.7–4.0)
MCH: 31.7 pg (ref 26.0–34.0)
MCHC: 35.7 g/dL (ref 30.0–36.0)
MCV: 88.8 fL (ref 78.0–100.0)
MONO ABS: 0.4 10*3/uL (ref 0.1–1.0)
Monocytes Relative: 9 % (ref 3–12)
NEUTROS ABS: 2.6 10*3/uL (ref 1.7–7.7)
NEUTROS PCT: 53 % (ref 43–77)
PLATELETS: 278 10*3/uL (ref 150–400)
RBC: 4.89 MIL/uL (ref 4.22–5.81)
RDW: 12.9 % (ref 11.5–15.5)
WBC: 4.9 10*3/uL (ref 4.0–10.5)

## 2014-01-19 MED ORDER — ATORVASTATIN CALCIUM 20 MG PO TABS: 20.0000 mg | ORAL_TABLET | Freq: Every day | ORAL | Status: DC

## 2014-01-19 MED ORDER — METRONIDAZOLE 1 % EX GEL: Freq: Every day | CUTANEOUS | Status: DC

## 2014-01-19 NOTE — Progress Notes (Addendum)
Subjective:  This chart was scribed for Stephen Agreste, MD, by Neta Ehlers, ED Scribe. This patient's care was started at 1:58 PM.   Patient ID: Stephen Conway, male    DOB: 1946/05/14, 68 y.o.   MRN: [id number]  HPI  Stephen Conway is a 68 y.o. male. PCP: Estill Dooms, MD  Stephen Conway is here for an annual exam. He is a prior pt of Dr. Elder Cyphers and transitioning to me for primary care. His last physical was a year ago in August 2014.   1. Screenings: Colon cancer screening- colonoscopy July 16 of this year. He had two sessile polyps that were 5 mm; plan on repeat colonoscopy in five years; pathology still pending.  Endoscopy in July of this year as he has a h/o GERD with Barrett's esophagus.  Noted to have few sessile poplys in gastric body and non-erosive gastritis. Biopsies were obtained; plan on discontinuing Barrett's surveilance if Barrett's not identified on biopsy. Continue on as PPI.  In the office, the pt reports a prostrate cancer screening last year; his last PSA was 0.55 in August of last year. The pt denies known prostate cancer in his family. His father died of a stroke at 62 years of age; the pt reports his father drank and smoke heavily.   3. Vaccines: He had pneumonia vaccine PVC 23 in February 2012.  Has not received Prevnar. Last listed tetanus was 2004. In the office, the pt consents to [place] vaccination. He also consents to a Tdap booster. The pt reports he received Zostavax in 2009.   4. Hyperlipidemia: Last total 220 with LDL of 128 in August last year. He was changed to Lipitor 20 mg qd. CMP was normal then.  In the office, he reports daily use of Lipitor and he denies known side effects including increased myopathies. He endorses a h/o baseline arthritis.   5. Physical: EKG performed in August 2014: Sinus rhythm with a few PVCs.  Aspirin: 81 mg once a day. Pt denies a h/o cardiac disease.   6. In the office, the pt reports a health care worker performing  a housecall on behalf of Medicare this week detected skipped beats and mildly elevated BP.  Mr. Hallstrom denies chest pains, lightheadedness, or dizziness- at rest or exertion. He reports he walks 10 miles a day or bikes 20 miles a day several times a week.   7.  Insomnia: Per chart has used both Sonata and Ambien prior. In the office, he reports very occasional use of 5 mg Ambien stating a maximum of one a week. The usage is typically restricted to when he is traveling abroad.   8. Erectile dysfunction: prescribed 100 mg Viagra qd PRN with Nov 20, 2013 phone call. Advised to split these in half as needed.  In the office, the pt reports he takes 50 mg Viagra approximately once a week. He denies headaches, hearing loss, visual changes, or chest pains associated with the medication. He states he has one unfilled prescription remaining.   9. Opt: Pt reports mildly increased pressure for which he takes latanoprost. His father had glaucoma.   10: Dental: Dr. Luretha Rued and Dr. Salome Arnt. The pt reports a recent root canal.   Stephen Conway states he smokes a cigar several times a year while playing golf. He also reports 2-3 glasses of wine a day with increased consumption on weekends.   Followed by dermatologist - hx of rosacea. Using metrogel BID, last derm eval 9  months ago - had FH of skin CA and has had some lesions frozen in past.  The pt is currently retired.   Patient Active Problem List   Diagnosis Date Noted  . Erectile dysfunction 02/03/2013  . Lipids abnormal 09/25/2011  . Personal history of colonic polyps 09/26/2010  . Barrett's esophagus 09/26/2010   Past Medical History  Diagnosis Date  . Barrett's esophagus 08/2006  . Adenomatous polyp   . Internal hemorrhoids 08/2005  . GERD (gastroesophageal reflux disease)   . Hyperlipidemia   . Allergy   . Cataract   . Glaucoma    Past Surgical History  Procedure Laterality Date  . Knee arthroscopy    . Cataract extraction    . Hemorrhoid  surgery    . Eye surgery    . Colonoscopy    . Upper gastrointestinal endoscopy     No Known Allergies  Prior to Admission medications   Medication Sig Start Date End Date Taking? Authorizing Provider  aspirin 81 MG tablet Take 81 mg by mouth daily.      Historical Provider, MD  atorvastatin (LIPITOR) 20 MG tablet  11/25/13   Historical Provider, MD  clindamycin (CLEOCIN) 150 MG capsule  01/09/14   Historical Provider, MD  latanoprost (XALATAN) 0.005 % ophthalmic solution  01/05/14   Historical Provider, MD  metroNIDAZOLE (METROGEL) 1 % gel Apply topically daily. 02/27/12   Argentina Donovan, PA-C  Multiple Vitamin (MULTIVITAMIN) tablet Take 1 tablet by mouth daily.      Historical Provider, MD  omeprazole (PRILOSEC) 20 MG capsule Take 1 capsule (20 mg total) by mouth daily. 01/15/14   Ladene Artist, MD  sildenafil (VIAGRA) 100 MG tablet Take 1 tablet (100 mg total) by mouth daily as needed for erectile dysfunction. 11/20/13   Orma Flaming, MD  zolpidem (AMBIEN) 10 MG tablet TAKE 1 TABLET BY MOUTH AT BEDTIME AS NEEDED 05/02/13   Orma Flaming, MD    Review of Systems  All other systems reviewed and are negative. 13 point ROS reviewed with the pt. 13 point review of systems per patient health survey noted.  Negative other than as indicated on reviewed nursing note.       Objective:   Physical Exam  Vitals reviewed. Constitutional: He is oriented to person, place, and time. He appears well-developed and well-nourished.  HENT:  Head: Normocephalic and atraumatic.  Right Ear: External ear normal.  Left Ear: External ear normal.  Mouth/Throat: Oropharynx is clear and moist.  Eyes: Conjunctivae and EOM are normal. Pupils are equal, round, and reactive to light.  Neck: Normal range of motion. Neck supple. No JVD present. Carotid bruit is not present. No thyromegaly present.  Cardiovascular: Normal rate, regular rhythm, normal heart sounds and intact distal pulses.   No murmur  heard. Pulmonary/Chest: Effort normal and breath sounds normal. No respiratory distress. He has no wheezes. He has no rales.  Abdominal: Soft. He exhibits no distension. There is no tenderness. Hernia confirmed negative in the right inguinal area and confirmed negative in the left inguinal area.  Genitourinary: Prostate normal. Right testis shows no mass and no tenderness. Left testis shows no mass and no tenderness.  No hernia. Testicles non-tender, no mass.  Digital rectal exam: No nodules. No apparent enlargement. No visible hemorrhoids.   Musculoskeletal: Normal range of motion. He exhibits no edema and no tenderness.  Lymphadenopathy:    He has no cervical adenopathy.  Neurological: He is alert and oriented to person, place,  and time. He has normal reflexes.  Skin: Skin is warm and dry.  Psychiatric: He has a normal mood and affect. His behavior is normal.    Filed Vitals:   01/19/14 1359  BP: 140/92  Pulse: 77  Temp: 97.8 F (36.6 C)  TempSrc: Oral  Resp: 16  Height: 5' 8.5" (1.74 m)  Weight: 200 lb 9.6 oz (90.992 kg)  SpO2: 98%    Visual Acuity Screening   Right eye Left eye Both eyes  Without correction: 20/25 20/50 20/25   With correction:      Depression and fall screening performed per flow sheet. Fall and depression screening reviewed. No positive responses.   EKG: SR, no acute findings.     Assessment & Plan:   NORIS KULINSKI is a 68 y.o. male Routine general medical examination at a health care facility - Plan: CBC with Differential, COMPLETE METABOLIC PANEL WITH GFR, Lipid panel, TSH  --anticipatory guidance as below in AVS, screening labs above. Health maintenance items as above in HPI discussed/recommended as applicable.   Updated pneumonia vaccination with Prevnar x1, and Tdap given. Up to date on vaccinations now.   PVC (premature ventricular contraction) - Plan: EKG 12-Lead  - seen on prior EKG and possibly by home reading from nurse. Not seen on EKG  in office. Asymptomatic, and with regular exercise without any symptoms, deferred any further workup at this point. If palpitations occur, lightheadedness or new sx's- rtc or ER.  Screening for prostate cancer - Plan: PSA, Medicare  - discussed pros and cons of prostate cancer screening, and after this discussion, he chose to have screening done. PSA obtained, and no concerning findings on DRE.   Need for prophylactic vaccination with combined diphtheria-tetanus-pertussis (DTP) vaccine - Plan: Tdap vaccine greater than or equal to 7yo IM - given.   Need for prophylactic vaccination with Streptococcus pneumoniae (Pneumococcus) and Influenza vaccines - Plan: Pneumococcal conjugate vaccine 13-valent IM - given.   Other and unspecified hyperlipidemia - Plan: atorvastatin (LIPITOR) 20 MG tablet  - due to slight elevation at prior CPE, was changed to Lipitor - tolerating this change, without apparent side effects. Lipids pending.   Rosacea - Plan: metroNIDAZOLE (METROGEL) 1 % gel  - refilled. Discussed this is usually QD dosing. Avoidance measures for flushing discussed.   - Advised to follow up with dermatology for routine follow up with his prior areas of concern and FH of skin CA.    Insomnia  - intermitent, noted with travel. He is only taking 5mg  dose when he does take it.  Discussed may change to 5mg  tablet at next refill for ease of use and at age 2, continue to use lowest effective dose. Denies parasomnias.   Meds ordered this encounter  Medications  . atorvastatin (LIPITOR) 20 MG tablet    Sig: Take 1 tablet (20 mg total) by mouth daily at 6 PM.    Dispense:  90 tablet    Refill:  3  . metroNIDAZOLE (METROGEL) 1 % gel    Sig: Apply topically daily.    Dispense:  135 g    Refill:  3   Patient Instructions  Schedule follow up with dermatologist. Ok to use metrogel once per day at the 1% dose.  Keep follow up with ophthalmologist and dentist.  Tdap and pneumonia vaccines (Prevnar 33)  given today.  You should receive a call or letter about your lab results within the next week to 10 days.  As discussed, when it  is time to refill the Ambien. May change to 5mg  dose, even though you are taking just 1/2 of 10mg  dose.  If you have any palpitations or skipped heartbeats - return to discuss these symptoms, but no further workup needed at this time for occasional extra beat on EKG.   Keeping you healthy  Get these tests  Blood pressure- Have your blood pressure checked once a year by your healthcare provider.  Normal blood pressure is 120/80  Weight- Have your body mass index (BMI) calculated to screen for obesity.  BMI is a measure of body fat based on height and weight. You can also calculate your own BMI at [employer].  Cholesterol- Have your cholesterol checked every year.  Diabetes- Have your blood sugar checked regularly if you have high blood pressure, high cholesterol, have a family history of diabetes or if you are overweight.  Screening for Colon Cancer- Colonoscopy starting at age 54.  Screening may begin sooner depending on your family history and other health conditions. Follow up colonoscopy as directed by your Gastroenterologist.  Screening for Prostate Cancer- Both blood work (PSA) and a rectal exam help screen for Prostate Cancer.  Screening begins at age 64 with African-American men and at age 34 with Caucasian men.  Screening may begin sooner depending on your family history.  Take these medicines  Aspirin- One aspirin daily can help prevent Heart disease and Stroke.  Flu shot- Every fall.  Tetanus- Every 10 years.  Zostavax- Once after the age of 61 to prevent Shingles.  Pneumonia shot- Once after the age of 39; if you are younger than 44, ask your healthcare provider if you need a Pneumonia shot.  Take these steps  Don't smoke- If you do smoke, talk to your doctor about quitting.  For tips on how to quit, go to www.smokefree.gov or  call 1-800-QUIT-NOW.  Be physically active- Exercise 5 days a week for at least 30 minutes.  If you are not already physically active start slow and gradually work up to 30 minutes of moderate physical activity.  Examples of moderate activity include walking briskly, mowing the yard, dancing, swimming, bicycling, etc.  Eat a healthy diet- Eat a variety of healthy food such as fruits, vegetables, low fat milk, low fat cheese, yogurt, lean meant, poultry, fish, beans, tofu, etc. For more information go to www.thenutritionsource.org  Drink alcohol in moderation- Limit alcohol intake to less than two drinks a day. Never drink and drive.  Dentist- Brush and floss twice daily; visit your dentist twice a year.  Depression- Your emotional health is as important as your physical health. If you're feeling down, or losing interest in things you would normally enjoy please talk to your healthcare provider.  Eye exam- Visit your eye doctor every year.  Safe sex- If you may be exposed to a sexually transmitted infection, use a condom.  Seat belts- Seat belts can save your life; always wear one.  Smoke/Carbon Monoxide detectors- These detectors need to be installed on the appropriate level of your home.  Replace batteries at least once a year.  Skin cancer- When out in the sun, cover up and use sunscreen 15 SPF or higher.  Violence- If anyone is threatening you, please tell your healthcare provider.  Living Will/ Health care power of attorney- Speak with your healthcare provider and family.   I personally performed the services described in this documentation, which was scribed in my presence. The recorded information has been reviewed and considered, and addended  by me as needed.

## 2014-01-19 NOTE — Patient Instructions (Signed)
Schedule follow up with dermatologist. Madaline Brilliant to use metrogel once per day at the 1% dose.  Keep follow up with ophthalmologist and dentist.  Tdap and pneumonia vaccines (Prevnar 33) given today.  You should receive a call or letter about your lab results within the next week to 10 days.  As discussed, when it is time to refill the Ambien. May change to 5mg  dose, even though you are taking just 1/2 of 10mg  dose.  If you have any palpitations or skipped heartbeats - return to discuss these symptoms, but no further workup needed at this time for occasional extra beat on EKG.   Keeping you healthy  Get these tests  Blood pressure- Have your blood pressure checked once a year by your healthcare provider.  Normal blood pressure is 120/80  Weight- Have your body mass index (BMI) calculated to screen for obesity.  BMI is a measure of body fat based on height and weight. You can also calculate your own BMI at ViewBanking.si.  Cholesterol- Have your cholesterol checked every year.  Diabetes- Have your blood sugar checked regularly if you have high blood pressure, high cholesterol, have a family history of diabetes or if you are overweight.  Screening for Colon Cancer- Colonoscopy starting at age 73.  Screening may begin sooner depending on your family history and other health conditions. Follow up colonoscopy as directed by your Gastroenterologist.  Screening for Prostate Cancer- Both blood work (PSA) and a rectal exam help screen for Prostate Cancer.  Screening begins at age 3 with African-American men and at age 66 with Caucasian men.  Screening may begin sooner depending on your family history.  Take these medicines  Aspirin- One aspirin daily can help prevent Heart disease and Stroke.  Flu shot- Every fall.  Tetanus- Every 10 years.  Zostavax- Once after the age of 59 to prevent Shingles.  Pneumonia shot- Once after the age of 96; if you are younger than 58, ask your healthcare  provider if you need a Pneumonia shot.  Take these steps  Don't smoke- If you do smoke, talk to your doctor about quitting.  For tips on how to quit, go to www.smokefree.gov or call 1-800-QUIT-NOW.  Be physically active- Exercise 5 days a week for at least 30 minutes.  If you are not already physically active start slow and gradually work up to 30 minutes of moderate physical activity.  Examples of moderate activity include walking briskly, mowing the yard, dancing, swimming, bicycling, etc.  Eat a healthy diet- Eat a variety of healthy food such as fruits, vegetables, low fat milk, low fat cheese, yogurt, lean meant, poultry, fish, beans, tofu, etc. For more information go to www.thenutritionsource.org  Drink alcohol in moderation- Limit alcohol intake to less than two drinks a day. Never drink and drive.  Dentist- Brush and floss twice daily; visit your dentist twice a year.  Depression- Your emotional health is as important as your physical health. If you're feeling down, or losing interest in things you would normally enjoy please talk to your healthcare provider.  Eye exam- Visit your eye doctor every year.  Safe sex- If you may be exposed to a sexually transmitted infection, use a condom.  Seat belts- Seat belts can save your life; always wear one.  Smoke/Carbon Monoxide detectors- These detectors need to be installed on the appropriate level of your home.  Replace batteries at least once a year.  Skin cancer- When out in the sun, cover up and use sunscreen 15  SPF or higher.  Violence- If anyone is threatening you, please tell your healthcare provider.  Living Will/ Health care power of attorney- Speak with your healthcare provider and family.

## 2014-01-19 NOTE — Progress Notes (Signed)
   Subjective:    Patient ID: Stephen Conway, male    DOB: 1945/11/23, 68 y.o.   MRN: [id number]  HPI    Review of Systems  Constitutional: Negative.   HENT: Positive for postnasal drip and rhinorrhea.   Eyes: Positive for itching.  Respiratory: Negative.   Cardiovascular: Negative.   Gastrointestinal: Negative.   Endocrine: Negative.   Genitourinary: Negative.   Musculoskeletal: Negative.   Skin: Negative.   Allergic/Immunologic: Positive for environmental allergies.  Neurological: Negative.   Hematological: Negative.   Psychiatric/Behavioral: Negative.        Objective:   Physical Exam        Assessment & Plan:

## 2014-01-20 ENCOUNTER — Encounter: Payer: Self-pay | Admitting: Gastroenterology

## 2014-01-20 LAB — COMPLETE METABOLIC PANEL WITH GFR
ALT: 25 U/L (ref 0–53)
AST: 24 U/L (ref 0–37)
Albumin: 4.3 g/dL (ref 3.5–5.2)
Alkaline Phosphatase: 58 U/L (ref 39–117)
BILIRUBIN TOTAL: 0.9 mg/dL (ref 0.2–1.2)
BUN: 16 mg/dL (ref 6–23)
CALCIUM: 9.3 mg/dL (ref 8.4–10.5)
CO2: 27 meq/L (ref 19–32)
CREATININE: 0.75 mg/dL (ref 0.50–1.35)
Chloride: 100 mEq/L (ref 96–112)
Glucose, Bld: 105 mg/dL — ABNORMAL HIGH (ref 70–99)
Potassium: 4.4 mEq/L (ref 3.5–5.3)
Sodium: 136 mEq/L (ref 135–145)
Total Protein: 7 g/dL (ref 6.0–8.3)

## 2014-01-20 LAB — LIPID PANEL
CHOLESTEROL: 195 mg/dL (ref 0–200)
HDL: 74 mg/dL (ref >39)
LDL Cholesterol: 107 mg/dL — ABNORMAL HIGH (ref 0–99)
TRIGLYCERIDES: 72 mg/dL (ref <150)
Total CHOL/HDL Ratio: 2.6 Ratio
VLDL: 14 mg/dL (ref 0–40)

## 2014-01-20 LAB — TSH: TSH: 1.816 u[IU]/mL (ref 0.350–4.500)

## 2014-01-20 LAB — PSA, MEDICARE: PSA: 0.5 ng/mL (ref <=4.00)

## 2014-02-16 ENCOUNTER — Encounter: Payer: Medicare Other | Admitting: Internal Medicine

## 2014-02-24 ENCOUNTER — Other Ambulatory Visit: Payer: Self-pay | Admitting: Internal Medicine

## 2014-03-22 ENCOUNTER — Other Ambulatory Visit: Payer: Self-pay | Admitting: Gastroenterology

## 2014-09-11 DIAGNOSIS — H04123 Dry eye syndrome of bilateral lacrimal glands: Secondary | ICD-10-CM | POA: Diagnosis not present

## 2014-09-11 DIAGNOSIS — H401211 Low-tension glaucoma, right eye, mild stage: Secondary | ICD-10-CM | POA: Diagnosis not present

## 2014-09-11 DIAGNOSIS — H401223 Low-tension glaucoma, left eye, severe stage: Secondary | ICD-10-CM | POA: Diagnosis not present

## 2014-09-11 DIAGNOSIS — H524 Presbyopia: Secondary | ICD-10-CM | POA: Diagnosis not present

## 2014-09-24 ENCOUNTER — Other Ambulatory Visit: Payer: Self-pay | Admitting: Gastroenterology

## 2014-10-12 ENCOUNTER — Ambulatory Visit (INDEPENDENT_AMBULATORY_CARE_PROVIDER_SITE_OTHER): Payer: Medicare Other

## 2014-10-12 ENCOUNTER — Ambulatory Visit (INDEPENDENT_AMBULATORY_CARE_PROVIDER_SITE_OTHER): Payer: Medicare Other | Admitting: Family Medicine

## 2014-10-12 VITALS — BP 144/80 | HR 77 | Temp 98.1°F | Resp 16 | Ht 68.5 in | Wt 207.0 lb

## 2014-10-12 DIAGNOSIS — M79671 Pain in right foot: Secondary | ICD-10-CM | POA: Diagnosis not present

## 2014-10-12 DIAGNOSIS — M79644 Pain in right finger(s): Secondary | ICD-10-CM | POA: Diagnosis not present

## 2014-10-12 NOTE — Progress Notes (Signed)
[facility] [address] [place] [address] [phone]  Date:  10/12/2014   Name:  [name]   DOB:  Jan 21, 1946   MRN:  [id number]  PCP:  [name], MD    Chief Complaint: Foreign Body in Skin and Foot Pain   History of Present Illness:  Stephen Conway is a 69 y.o. very pleasant male patient who presents with the following:  He has noted some pain in the bottom of his right foot for a couple of months.  He does play a good deal of tennis.  Not aware of any particular injury.  He has most pain under the MT heads, not worse in the morning.  He feels like the foot is bruised  He lso notes a wooden splinter in his right thumb- a large splinter got into his thumb when he was doing some woodwoking one week ago.  He pulled out most of the wood but thinks there may still be more splinter in his thumb.  It continues to be tender.  He has tried warm compresses and drawing salve.    Tetanus shot last summer  He is OW well today   Patient Active Problem List   Diagnosis Date Noted  . Erectile dysfunction 02/03/2013  . Lipids abnormal 09/25/2011  . Personal history of colonic polyps 09/26/2010  . Barrett's esophagus 09/26/2010    Past Medical History  Diagnosis Date  . Barrett's esophagus 08/2006  . Adenomatous polyp   . Internal hemorrhoids 08/2005  . GERD (gastroesophageal reflux disease)   . Hyperlipidemia   . Allergy   . Cataract   . Glaucoma     Past Surgical History  Procedure Laterality Date  . Knee arthroscopy    . Cataract extraction    . Hemorrhoid surgery    . Eye surgery    . Colonoscopy    . Upper gastrointestinal endoscopy      History  Substance Use Topics  . Smoking status: Former Smoker    Types: Cigars  . Smokeless tobacco: Never Used     Comment: per pt-smoke a cigar every month  . Alcohol Use: 8.4 oz/week    14 Glasses of wine per week     Comment: couple glasses of wine nightly    Family History   Problem Relation Age of Onset  . Diabetes Father   . Stroke Father   . Stomach cancer Brother   . Colon cancer Neg Hx     No Known Allergies  Medication list has been reviewed and updated.  Current Outpatient Prescriptions on File Prior to Visit  Medication Sig Dispense Refill  . aspirin 81 MG tablet Take 81 mg by mouth daily.      Marland Kitchen atorvastatin (LIPITOR) 20 MG tablet Take 1 tablet (20 mg total) by mouth daily at 6 PM. 90 tablet 3  . latanoprost (XALATAN) 0.005 % ophthalmic solution     . metroNIDAZOLE (METROGEL) 1 % gel Apply topically daily. 135 g 3  . omeprazole (PRILOSEC) 20 MG capsule Take 1 capsule (20 mg total) by mouth daily. 30 capsule 11  . omeprazole (PRILOSEC) 20 MG capsule TAKE 1 CAPSULE BY MOUTH ONCE DAILY 30 capsule 1  . sildenafil (VIAGRA) 100 MG tablet Take 1 tablet (100 mg total) by mouth daily as needed for erectile dysfunction. 18 tablet 0  . zolpidem (AMBIEN) 10 MG tablet TAKE 1 TABLET BY MOUTH AT BEDTIME AS NEEDED 90 tablet 1  .  clindamycin (CLEOCIN) 150 MG capsule     . Multiple Vitamin (MULTIVITAMIN) tablet Take 1 tablet by mouth daily.      . [DISCONTINUED] zaleplon (SONATA) 10 MG capsule 10 mg. One tablet by mouth once a week      No current facility-administered medications on file prior to visit.    Review of Systems:  As per HPI- otherwise negative.   Physical Examination: Filed Vitals:   10/12/14 1436  BP: 144/80  Pulse: 77  Temp: 98.1 F (36.7 C)  Resp: 16   Filed Vitals:   10/12/14 1436  Height: 5' 8.5" (1.74 m)  Weight: 207 lb (93.895 kg)   Body mass index is 31.01 kg/(m^2). Ideal Body Weight: Weight in (lb) to have BMI = 25: 166.5  GEN: WDWN, NAD, Non-toxic, A & O x 3, loks well HEENT: Atraumatic, Normocephalic. Neck supple. No masses, No LAD. Ears and Nose: No external deformity. CV: RRR, No M/G/R. No JVD. No thrill. No extra heart sounds. PULM: CTA B, no wheezes, crackles, rhonchi. No retractions. No resp. distress. No  accessory muscle use. ABD: S, NT, ND, +BS. No rebound. No HSM. EXTR: No c/c/e NEURO Normal gait.  PSYCH: Normally interactive. Conversant. Not depressed or anxious appearing.  Calm demeanor.  Right foot: he is slightly tender over the distal MTs, especially 2/3/4. No tenderness at plantar fascia insertions, achilles is normal.  Negative squeeze test.   Right thumb: there is a small wound on the pad of the thumb, and this area is tender.  No apparent foreign body visible.    [facility] reading (PRIMARY) by  Dr. Lorelei Pont. Right thumb: no visible FB, OW normal RIGHT THUMB 2+V  COMPARISON: None.  FINDINGS: There does appear to be some soft tissue swelling but there is no radiopaque foreign object, bone or articular finding. Clydene Laming is usually invisible at radiography but can be detected by CT in certain instances.  IMPRESSION: Soft tissue swelling. No other finding.  VC obtained.  Cleaned pad of thumb with alcohol.  Used 20 gauge needle to open skin over splinter entrance wound.  Squeezed and did not find a splinter but did remove a small amount of pus.  Pt noted that the area felt more comfortable after evacuating the pus.    Assessment and Plan: Pain of right thumb - Plan: DG Finger Thumb Right  Pain in right foot  Advised that there may be some splinter left in the pad of his thumb.  However as we are not able to visualize it I would not recommend that we explore the thumb trying to find it.  Likely it will either become apparent or wall off.  He will seek care again if the area is worsening.  For now continue warm soaks  Suspect his foot pain is due to his high activity level and atrophy of the plantar fat pad. He will try cushioned insoles   Signed Lamar Blinks, MD

## 2014-11-13 DIAGNOSIS — H401223 Low-tension glaucoma, left eye, severe stage: Secondary | ICD-10-CM | POA: Diagnosis not present

## 2014-11-13 DIAGNOSIS — H401211 Low-tension glaucoma, right eye, mild stage: Secondary | ICD-10-CM | POA: Diagnosis not present

## 2014-11-25 ENCOUNTER — Other Ambulatory Visit: Payer: Self-pay | Admitting: Gastroenterology

## 2014-12-18 ENCOUNTER — Other Ambulatory Visit: Payer: Self-pay

## 2014-12-18 DIAGNOSIS — E785 Hyperlipidemia, unspecified: Secondary | ICD-10-CM

## 2014-12-18 NOTE — Telephone Encounter (Signed)
Pharm reqs RF of atorvastatin. Pt has appt sched for 02/08/15. Is it OK to fill through then, 90 day supply?

## 2014-12-21 MED ORDER — ATORVASTATIN CALCIUM 20 MG PO TABS: 20.0000 mg | ORAL_TABLET | Freq: Every day | ORAL | Status: DC

## 2014-12-21 NOTE — Telephone Encounter (Signed)
Okay to refill, keep appointment for August 8th. Thanks

## 2015-01-05 DIAGNOSIS — L259 Unspecified contact dermatitis, unspecified cause: Secondary | ICD-10-CM | POA: Diagnosis not present

## 2015-01-05 DIAGNOSIS — L570 Actinic keratosis: Secondary | ICD-10-CM | POA: Diagnosis not present

## 2015-01-16 ENCOUNTER — Other Ambulatory Visit: Payer: Self-pay | Admitting: Gastroenterology

## 2015-01-19 ENCOUNTER — Other Ambulatory Visit: Payer: Self-pay | Admitting: Gastroenterology

## 2015-01-19 ENCOUNTER — Telehealth: Payer: Self-pay | Admitting: Gastroenterology

## 2015-01-19 MED ORDER — OMEPRAZOLE 20 MG PO CPDR: 20.0000 mg | DELAYED_RELEASE_CAPSULE | Freq: Every day | ORAL | Status: DC

## 2015-01-19 NOTE — Telephone Encounter (Signed)
Prescription sent to patient's pharmacy until scheduled appt on 03/25/15.

## 2015-01-20 ENCOUNTER — Other Ambulatory Visit: Payer: Self-pay

## 2015-01-20 MED ORDER — OMEPRAZOLE 20 MG PO CPDR: 20.0000 mg | DELAYED_RELEASE_CAPSULE | Freq: Every day | ORAL | Status: DC

## 2015-02-08 ENCOUNTER — Ambulatory Visit (INDEPENDENT_AMBULATORY_CARE_PROVIDER_SITE_OTHER): Payer: Medicare Other | Admitting: Family Medicine

## 2015-02-08 ENCOUNTER — Encounter: Payer: Self-pay | Admitting: Family Medicine

## 2015-02-08 VITALS — BP 138/80 | HR 83 | Temp 97.8°F | Resp 16 | Ht 69.0 in | Wt 203.8 lb

## 2015-02-08 DIAGNOSIS — Z0000 Encounter for general adult medical examination without abnormal findings: Secondary | ICD-10-CM | POA: Diagnosis not present

## 2015-02-08 DIAGNOSIS — Z1329 Encounter for screening for other suspected endocrine disorder: Secondary | ICD-10-CM | POA: Diagnosis not present

## 2015-02-08 DIAGNOSIS — K219 Gastro-esophageal reflux disease without esophagitis: Secondary | ICD-10-CM

## 2015-02-08 DIAGNOSIS — H40059 Ocular hypertension, unspecified eye: Secondary | ICD-10-CM | POA: Diagnosis not present

## 2015-02-08 DIAGNOSIS — L719 Rosacea, unspecified: Secondary | ICD-10-CM | POA: Diagnosis not present

## 2015-02-08 DIAGNOSIS — Z130 Encounter for screening for diseases of the blood and blood-forming organs and certain disorders involving the immune mechanism: Secondary | ICD-10-CM | POA: Diagnosis not present

## 2015-02-08 DIAGNOSIS — G4700 Insomnia, unspecified: Secondary | ICD-10-CM

## 2015-02-08 DIAGNOSIS — Z125 Encounter for screening for malignant neoplasm of prostate: Secondary | ICD-10-CM | POA: Diagnosis not present

## 2015-02-08 DIAGNOSIS — E785 Hyperlipidemia, unspecified: Secondary | ICD-10-CM | POA: Diagnosis not present

## 2015-02-08 DIAGNOSIS — N529 Male erectile dysfunction, unspecified: Secondary | ICD-10-CM

## 2015-02-08 LAB — POCT URINALYSIS DIPSTICK
BILIRUBIN UA: NEGATIVE
Blood, UA: NEGATIVE
GLUCOSE UA: NEGATIVE
Leukocytes, UA: NEGATIVE
Nitrite, UA: NEGATIVE
PH UA: 5
PROTEIN UA: NEGATIVE
Spec Grav, UA: 1.025
UROBILINOGEN UA: 0.2

## 2015-02-08 LAB — CBC
HCT: 44.9 % (ref 39.0–52.0)
Hemoglobin: 15.5 g/dL (ref 13.0–17.0)
MCH: 31.6 pg (ref 26.0–34.0)
MCHC: 34.5 g/dL (ref 30.0–36.0)
MCV: 91.6 fL (ref 78.0–100.0)
MPV: 10 fL (ref 8.6–12.4)
Platelets: 223 10*3/uL (ref 150–400)
RBC: 4.9 MIL/uL (ref 4.22–5.81)
RDW: 13.2 % (ref 11.5–15.5)
WBC: 4.7 10*3/uL (ref 4.0–10.5)

## 2015-02-08 LAB — COMPLETE METABOLIC PANEL WITH GFR
ALT: 23 U/L (ref 9–46)
AST: 23 U/L (ref 10–35)
Albumin: 4 g/dL (ref 3.6–5.1)
Alkaline Phosphatase: 60 U/L (ref 40–115)
BUN: 14 mg/dL (ref 7–25)
CALCIUM: 9.2 mg/dL (ref 8.6–10.3)
CO2: 26 mmol/L (ref 20–31)
CREATININE: 0.82 mg/dL (ref 0.70–1.18)
Chloride: 101 mmol/L (ref 98–110)
GFR, Est Non African American: >89 mL/min (ref >=60)
Glucose, Bld: 97 mg/dL (ref 65–99)
Potassium: 4.5 mmol/L (ref 3.5–5.3)
Sodium: 140 mmol/L (ref 135–146)
TOTAL PROTEIN: 6.4 g/dL (ref 6.1–8.1)
Total Bilirubin: 0.9 mg/dL (ref 0.2–1.2)

## 2015-02-08 LAB — LIPID PANEL
CHOL/HDL RATIO: 2.6 ratio (ref <=5.0)
Cholesterol: 209 mg/dL — ABNORMAL HIGH (ref 125–200)
HDL: 79 mg/dL (ref >=40)
LDL Cholesterol: 114 mg/dL (ref <130)
TRIGLYCERIDES: 78 mg/dL (ref <150)
VLDL: 16 mg/dL (ref <30)

## 2015-02-08 LAB — TSH: TSH: 1.285 u[IU]/mL (ref 0.350–4.500)

## 2015-02-08 MED ORDER — SILDENAFIL CITRATE 100 MG PO TABS: 100.0000 mg | ORAL_TABLET | Freq: Every day | ORAL | Status: DC | PRN

## 2015-02-08 MED ORDER — ZOLPIDEM TARTRATE 5 MG PO TABS: 2.5000 mg | ORAL_TABLET | Freq: Every evening | ORAL | Status: DC | PRN

## 2015-02-08 MED ORDER — ATORVASTATIN CALCIUM 20 MG PO TABS: 20.0000 mg | ORAL_TABLET | Freq: Every day | ORAL | Status: DC

## 2015-02-08 NOTE — Progress Notes (Addendum)
Subjective:  This chart was scribed for Merri Ray, MD by Thea Alken, ED Scribe. This patient was seen in room 24 and the patient's care was started at 1:28 PM.   Patient ID: Stephen Conway, male    DOB: [birth year], 69 y.o.   MRN: [id number]  HPI   Chief Complaint  Patient presents with  . Annual Exam   HPI Comments: Stephen Conway is a 69 y.o. male who presents to the [facility] for annual exam.   He has hx of hyperlipidemia, rosacea, GERD, insomnia and erectile dysfunction.   1) Cancer screening. Colonoscopy July 2015, planned to repeat in 5 years. Also had endoscopy, by Dr, Fuller Plan at the same time and is due for visit.    Prostate cancer screening, normal. He agrees to prostate cancer screening today.   Lab Results  Component Value Date   PSA 0.50 01/19/2014   PSA 0.55 02/03/2013   2) Immunization  Immunization History  Administered Date(s) Administered  . Hepatitis A 02/24/1997, 02/18/2004  . Hepatitis A, Adult 07/28/2004  . Influenza-Unspecified 03/28/2014  . MMR 02/18/2004  . Pneumococcal Conjugate-13 01/19/2014  . Pneumococcal Polysaccharide-23 08/10/2010  . Td 07/03/2002  . Tdap 01/19/2014   He is UTD on immunizations. He states he received Zostavax in 2009  3) Exercise Pt exercises frequently without CP or dyspnea as of last visit. He had EKG of August 2014 that showed some PVC's. Normal EKG July 2015. He is asymptomatic with exercise, he denies chest tightness and trouble breathing.   4) Dentist  He was followed by Dr. Luretha Rued and Dr. Marella Chimes but has recently switched to Dr. Gordy Levan.  5) Eye care He is followed but eye specialist latanoprost for increase IOP. Father had glaucoma.  6) Depression screening  Depression screen [facility] 2/9 02/08/2015 01/19/2014  Decreased Interest 0 0  Down, Depressed, Hopeless 0 0  PHQ - 2 Score 0 0    7)Functional statis screening No positive statis on in office screening  8)Advance directives.    Discussed in office. Thinks he has at home. Has talked to an attorney. Has been advised to bring in paperwork in office.  9) GERD Takes Prilosec 20 mg Qd. Hx of Barrett's. Takes prilosec 5-6 days per week and is tolerating medication well.   10) Rosacea Uses metrogel 1% once per day. Followed by dermatologist, Dr. Tobias Alexander.   11) Hyperlipidemia Takes Lipitor 20 mg qd. He tolerating medication well and denies having side effects.   Lab Results  Component Value Date   CHOL 195 01/19/2014   HDL 74 01/19/2014   LDLCALC 107* 01/19/2014   TRIG 72 01/19/2014   CHOLHDL 2.6 01/19/2014   Overall controlled.  12) Erectile dysfunction Has used Viagra $RemoveBef'50mg'AaNhxcdTkZ$  as needed. Usually splits 100 mg in half. He is tolerating Viagra well and has been taking this once a week. He is needing a refill of medication today. He denies visual changes, CP, dizziness, and light headedness while taking medication.   13) Insomnia Takes ambien 10 mg table encourage to split in half due to side effects and age. Uses as needed for travel usually.   Other Pt c/o a blister to left foot that he states is due to playing tennis.   He has noticed twitching in left ear. 2  Patient Active Problem List   Diagnosis Date Noted  . Erectile dysfunction 02/03/2013  . Lipids abnormal 09/25/2011  . Personal history of colonic polyps 09/26/2010  . Barrett's  esophagus 09/26/2010   Past Medical History  Diagnosis Date  . Barrett's esophagus 08/2006  . Adenomatous polyp   . Internal hemorrhoids 08/2005  . GERD (gastroesophageal reflux disease)   . Hyperlipidemia   . Allergy   . Cataract   . Glaucoma    Past Surgical History  Procedure Laterality Date  . Knee arthroscopy    . Cataract extraction    . Hemorrhoid surgery    . Eye surgery    . Colonoscopy    . Upper gastrointestinal endoscopy     No Known Allergies Prior to Admission medications   Medication Sig Start Date End Date Taking? Authorizing Provider   aspirin 81 MG tablet Take 81 mg by mouth daily.      Historical Provider, MD  atorvastatin (LIPITOR) 20 MG tablet Take 1 tablet (20 mg total) by mouth daily. 12/21/14   [name], MD  clindamycin (CLEOCIN) 150 MG capsule  01/09/14   Historical Provider, MD  latanoprost (XALATAN) 0.005 % ophthalmic solution  01/05/14   Historical Provider, MD  metroNIDAZOLE (METROGEL) 1 % gel Apply topically daily. 01/19/14   [name], MD  Multiple Vitamin (MULTIVITAMIN) tablet Take 1 tablet by mouth daily.      Historical Provider, MD  omeprazole (PRILOSEC) 20 MG capsule Take 1 capsule (20 mg total) by mouth daily. 01/20/15   Ladene Artist, MD  sildenafil (VIAGRA) 100 MG tablet Take 1 tablet (100 mg total) by mouth daily as needed for erectile dysfunction. 11/20/13   Orma Flaming, MD  zolpidem (AMBIEN) 10 MG tablet TAKE 1 TABLET BY MOUTH AT BEDTIME AS NEEDED 05/02/13   Orma Flaming, MD   History   Social History  . Marital Status: Single    Spouse Name: N/A  . Number of Children: 2  . Years of Education: N/A   Occupational History  . Retired    Social History Main Topics  . Smoking status: Former Smoker    Types: Cigars  . Smokeless tobacco: Never Used     Comment: per pt-smoke a cigar every month  . Alcohol Use: 8.4 oz/week    14 Glasses of wine per week     Comment: couple glasses of wine nightly  . Drug Use: No  . Sexual Activity: Not on file   Other Topics Concern  . Not on file   Social History Narrative    Review of Systems  13 point ROS. negative other than what is lisited in HPI  Objective:   Physical Exam  Constitutional: He is oriented to person, place, and time. He appears well-developed and well-nourished.  HENT:  Head: Normocephalic and atraumatic.  Right Ear: External ear normal.  Left Ear: External ear normal.  Mouth/Throat: Oropharynx is clear and moist.  Eyes: Conjunctivae and EOM are normal. Pupils are equal, round, and reactive to light.  Neck: Normal  range of motion. Neck supple. No thyromegaly present.  Cardiovascular: Normal rate, regular rhythm, normal heart sounds and intact distal pulses.   Pulmonary/Chest: Effort normal and breath sounds normal. No respiratory distress. He has no wheezes.  Abdominal: Soft. He exhibits no distension. There is no tenderness. Hernia confirmed negative in the right inguinal area and confirmed negative in the left inguinal area.  Genitourinary: Prostate normal.  Musculoskeletal: Normal range of motion. He exhibits no edema or tenderness.  Lymphadenopathy:    He has no cervical adenopathy.  Neurological: He is alert and oriented to person, place, and time. He has  normal reflexes.  Skin: Skin is warm and dry.  Multiple scatter nevi across back. A few darker ones on left flank. Is followed by dermatology yearly  Psychiatric: He has a normal mood and affect. His behavior is normal.  Vitals reviewed.   Filed Vitals:   02/08/15 1334  BP: 138/80  Pulse: 83  Temp: 97.8 F (36.6 C)  TempSrc: Oral  Resp: 16  Height: 5\' 9"  (1.753 m)  Weight: 203 lb 12.8 oz (92.443 kg)  SpO2: 98%    Visual Acuity Screening   Right eye Left eye Both eyes  Without correction:     With correction: 20/70 20/20 20/20    Appears to be a change in right from 20/25 from last visit and left eye improved to 20/50 . Wearing contacts for near vision in right eye only.   Assessment & Plan:   Stephen Conway is a 69 y.o. male Annual physical exam - Plan: TSH, POCT urinalysis dipstick  --anticipatory guidance as below in AVS, screening labs above. Health maintenance items as above in HPI discussed/recommended as applicable.   Gastroesophageal reflux disease, esophagitis presence not specified  -History of Barrett's esophagus. GERD symptoms controlled with 5-6 days per week PPI. Continue routine follow-up with GI, same dose of PPI for now.  Hyperlipidemia - Plan: COMPLETE METABOLIC PANEL WITH GFR, Lipid panel, TSH, POCT  urinalysis dipstick, atorvastatin (LIPITOR) 20 MG tablet  -Borderline LDL last year, overall stable. Tolerating Lipitor. Continue same dose, lipid panel pending.  Rosacea  -Stable, continue MetroGel, trigger avoidance.  Increased intraocular pressure, unspecified laterality - Plan: TSH  -Without known true glaucoma. Continue latanoprost drops, and follow-up with ophthalmology as scheduled.  Erectile dysfunction, unspecified erectile dysfunction type - Plan: POCT urinalysis dipstick, sildenafil (VIAGRA) 100 MG tablet  -Stable with one half tablet dosing. viagra Rx given - use lowest effective dose. Side effects discussed (including but not limited to headache/flushing, blue discoloration of vision, possible vascular steal and risk of cardiac effects if underlying unknown coronary artery disease, and permanent sensorineural hearing loss). Understanding expressed.  Special screening for malignant neoplasm of prostate - Plan: PSA, Medicare  - We discussed pros and cons of prostate cancer screening, and after this discussion, he chose to have screening done. PSA obtained, and no concerning findings on DRE.   Screening for hypothyroidism - Plan: TSH  Screening, anemia, deficiency, iron - Plan: CBC  Insomnia - Plan: zolpidem (AMBIEN) 5 MG tablet  Side effects discussed with Ambien, he has tolerated 5 mg dose without difficulty, but can try splitting this to a 2.5 mg dose. Continue infrequent dosing as needed only.. Ambien refilled for #30 with 1 refill.  Meds ordered this encounter  Medications  . atorvastatin (LIPITOR) 20 MG tablet    Sig: Take 1 tablet (20 mg total) by mouth daily.    Dispense:  90 tablet    Refill:  3  . sildenafil (VIAGRA) 100 MG tablet    Sig: Take 1 tablet (100 mg total) by mouth daily as needed for erectile dysfunction.    Dispense:  18 tablet    Refill:  0  . zolpidem (AMBIEN) 5 MG tablet    Sig: Take 0.5-1 tablets (2.5-5 mg total) by mouth at bedtime as needed for  sleep.    Dispense:  30 tablet    Refill:  1   Patient Instructions  You should receive a call or letter about your lab results within the next week to 10 days.  Try decreased dose  of Ambien - 1/2 to 1 at a time.  Return to the clinic or go to the nearest emergency room if any of your symptoms worsen or new symptoms occur.   Keeping you healthy  Get these tests  Blood pressure- Have your blood pressure checked once a year by your healthcare provider.  Normal blood pressure is 120/80  Weight- Have your body mass index (BMI) calculated to screen for obesity.  BMI is a measure of body fat based on height and weight. You can also calculate your own BMI at ViewBanking[employer]si.  Cholesterol- Have your cholesterol checked every year.  Diabetes- Have your blood sugar checked regularly if you have high blood pressure, high cholesterol, have a family history of diabetes or if you are overweight.  Screening for Colon Cancer- Colonoscopy starting at age 46.  Screening may begin sooner depending on your family history and other health conditions. Follow up colonoscopy as directed by your Gastroenterologist.  Screening for Prostate Cancer- Both blood work (PSA) and a rectal exam help screen for Prostate Cancer.  Screening begins at age 22 with African-American men and at age 42 with Caucasian men.  Screening may begin sooner depending on your family history.  Take these medicines  Aspirin- One aspirin daily can help prevent Heart disease and Stroke.  Flu shot- Every fall.  Tetanus- Every 10 years.  Zostavax- Once after the age of 12 to prevent Shingles.  Pneumonia shot- Once after the age of 1; if you are younger than 33, ask your healthcare provider if you need a Pneumonia shot.  Take these steps  Don't smoke- If you do smoke, talk to your doctor about quitting.  For tips on how to quit, go to www.smokefree.gov or call 1-800-QUIT-NOW.  Be physically active- Exercise 5 days a week  for at least 30 minutes.  If you are not already physically active start slow and gradually work up to 30 minutes of moderate physical activity.  Examples of moderate activity include walking briskly, mowing the yard, dancing, swimming, bicycling, etc.  Eat a healthy diet- Eat a variety of healthy food such as fruits, vegetables, low fat milk, low fat cheese, yogurt, lean meant, poultry, fish, beans, tofu, etc. For more information go to www.thenutritionsource.org  Drink alcohol in moderation- Limit alcohol intake to less than two drinks a day. Never drink and drive.  Dentist- Brush and floss twice daily; visit your dentist twice a year.  Depression- Your emotional health is as important as your physical health. If you're feeling down, or losing interest in things you would normally enjoy please talk to your healthcare provider.  Eye exam- Visit your eye doctor every year.  Safe sex- If you may be exposed to a sexually transmitted infection, use a condom.  Seat belts- Seat belts can save your life; always wear one.  Smoke/Carbon Monoxide detectors- These detectors need to be installed on the appropriate level of your home.  Replace batteries at least once a year.  Skin cancer- When out in the sun, cover up and use sunscreen 15 SPF or higher.  Violence- If anyone is threatening you, please tell your healthcare provider.  Living Will/ Health care power of attorney- Speak with your healthcare provider and family.    I personally performed the services described in this documentation, which was scribed in my presence. The recorded information has been reviewed and considered, and addended by me as needed.

## 2015-02-08 NOTE — Patient Instructions (Signed)
You should receive a call or letter about your lab results within the next week to 10 days.  Try decreased dose of Ambien - 1/2 to 1 at a time.  Return to the clinic or go to the nearest emergency room if any of your symptoms worsen or new symptoms occur.   Keeping you healthy  Get these tests  Blood pressure- Have your blood pressure checked once a year by your healthcare provider.  Normal blood pressure is 120/80  Weight- Have your body mass index (BMI) calculated to screen for obesity.  BMI is a measure of body fat based on height and weight. You can also calculate your own BMI at [email or web address].  Cholesterol- Have your cholesterol checked every year.  Diabetes- Have your blood sugar checked regularly if you have high blood pressure, high cholesterol, have a family history of diabetes or if you are overweight.  Screening for Colon Cancer- Colonoscopy starting at age 64.  Screening may begin sooner depending on your family history and other health conditions. Follow up colonoscopy as directed by your Gastroenterologist.  Screening for Prostate Cancer- Both blood work (PSA) and a rectal exam help screen for Prostate Cancer.  Screening begins at age 52 with African-American men and at age 60 with Caucasian men.  Screening may begin sooner depending on your family history.  Take these medicines  Aspirin- One aspirin daily can help prevent Heart disease and Stroke.  Flu shot- Every fall.  Tetanus- Every 10 years.  Zostavax- Once after the age of 61 to prevent Shingles.  Pneumonia shot- Once after the age of 47; if you are younger than 21, ask your healthcare provider if you need a Pneumonia shot.  Take these steps  Don't smoke- If you do smoke, talk to your doctor about quitting.  For tips on how to quit, go to www.smokefree.gov or call 1-800-QUIT-NOW.  Be physically active- Exercise 5 days a week for at least 30 minutes.  If you are not already physically active start  slow and gradually work up to 30 minutes of moderate physical activity.  Examples of moderate activity include walking briskly, mowing the yard, dancing, swimming, bicycling, etc.  Eat a healthy diet- Eat a variety of healthy food such as fruits, vegetables, low fat milk, low fat cheese, yogurt, lean meant, poultry, fish, beans, tofu, etc. For more information go to www.thenutritionsource.org  Drink alcohol in moderation- Limit alcohol intake to less than two drinks a day. Never drink and drive.  Dentist- Brush and floss twice daily; visit your dentist twice a year.  Depression- Your emotional health is as important as your physical health. If you're feeling down, or losing interest in things you would normally enjoy please talk to your healthcare provider.  Eye exam- Visit your eye doctor every year.  Safe sex- If you may be exposed to a sexually transmitted infection, use a condom.  Seat belts- Seat belts can save your life; always wear one.  Smoke/Carbon Monoxide detectors- These detectors need to be installed on the appropriate level of your home.  Replace batteries at least once a year.  Skin cancer- When out in the sun, cover up and use sunscreen 15 SPF or higher.  Violence- If anyone is threatening you, please tell your healthcare provider.  Living Will/ Health care power of attorney- Speak with your healthcare provider and family.

## 2015-02-09 LAB — PSA, MEDICARE: PSA: 0.59 ng/mL (ref <=4.00)

## 2015-02-16 ENCOUNTER — Encounter: Payer: Self-pay | Admitting: Family Medicine

## 2015-03-15 ENCOUNTER — Telehealth: Payer: Self-pay | Admitting: Gastroenterology

## 2015-03-15 MED ORDER — OMEPRAZOLE 20 MG PO CPDR: 20.0000 mg | DELAYED_RELEASE_CAPSULE | Freq: Every day | ORAL | Status: DC

## 2015-03-15 NOTE — Telephone Encounter (Signed)
Patient states he has had a problem lining up his schedule with Dr. Lynne Leader is on to see him now in November but he needs refills on omeprazole. Informed patient to keep appt but will give him enough refills until then. Pt verbalized understanding.

## 2015-03-15 NOTE — Addendum Note (Signed)
Addended by: Marzella Schlein on: 03/15/2015 10:36 AM   Modules accepted: Orders

## 2015-03-25 ENCOUNTER — Ambulatory Visit: Payer: Self-pay | Admitting: Gastroenterology

## 2015-04-26 ENCOUNTER — Ambulatory Visit: Payer: Self-pay | Admitting: Gastroenterology

## 2015-05-11 ENCOUNTER — Encounter: Payer: Self-pay | Admitting: Gastroenterology

## 2015-05-11 ENCOUNTER — Ambulatory Visit: Payer: Self-pay | Admitting: Gastroenterology

## 2015-05-11 ENCOUNTER — Ambulatory Visit (INDEPENDENT_AMBULATORY_CARE_PROVIDER_SITE_OTHER): Payer: Medicare Other | Admitting: Gastroenterology

## 2015-05-11 VITALS — BP 124/82 | HR 64 | Ht 68.5 in | Wt 205.0 lb

## 2015-05-11 DIAGNOSIS — K2270 Barrett's esophagus without dysplasia: Secondary | ICD-10-CM | POA: Diagnosis not present

## 2015-05-11 DIAGNOSIS — Z86010 Personal history of colonic polyps: Secondary | ICD-10-CM

## 2015-05-11 MED ORDER — OMEPRAZOLE 20 MG PO CPDR: 20.0000 mg | DELAYED_RELEASE_CAPSULE | Freq: Every day | ORAL | Status: DC

## 2015-05-11 NOTE — Patient Instructions (Signed)
We have sent the following medications to your pharmacy for you to pick up at your convenience:omeprazole.  Thank you for choosing me and  Gastroenterology.  Malcolm T. Stark, Jr., MD., FACG   

## 2015-05-11 NOTE — Progress Notes (Signed)
    History of Present Illness: This is a 69 year old male with a history of Barrett's esophagus. His reflux symptoms are well controlled on omeprazole 20 mg daily. He notes occasional breakthrough reflux symptoms related to certain foods. Last EGD in 12/2013 showed short segment of Barrett's without dysplasia. Denies weight loss, abdominal pain, constipation, diarrhea, change in stool caliber, melena, hematochezia, nausea, vomiting, dysphagia, reflux symptoms, chest pain.  Current Medications, Allergies, Past Medical History, Past Surgical History, Family History and Social History were reviewed in [employer] record.  Physical Exam: General: Well developed, well nourished, no acute distress Head: Normocephalic and atraumatic Eyes:  sclerae anicteric, EOMI Ears: Normal auditory acuity Mouth: No deformity or lesions Lungs: Clear throughout to auscultation Heart: Regular rate and rhythm; no murmurs, rubs or bruits Abdomen: Soft, non tender and non distended. No masses, hepatosplenomegaly or hernias noted. Normal Bowel sounds Musculoskeletal: Symmetrical with no gross deformities  Pulses:  Normal pulses noted Extremities: No clubbing, cyanosis, edema or deformities noted Neurological: Alert oriented x 4, grossly nonfocal Psychological:  Alert and cooperative. Normal mood and affect  Assessment and Recommendations:  1. Short segment Barrett's esophagus without dysplasia. Continue omeprazole 20 mg daily and standard antireflux measures. Surveillance endoscopy recommended in July 2018.  2. Personal history of adenomatous colon polyps. Five-year interval surveillance colonoscopy recommended in July 2020.

## 2015-06-23 ENCOUNTER — Telehealth: Payer: Self-pay

## 2015-10-07 ENCOUNTER — Other Ambulatory Visit: Payer: Self-pay

## 2015-10-07 DIAGNOSIS — N529 Male erectile dysfunction, unspecified: Secondary | ICD-10-CM

## 2015-10-07 NOTE — Telephone Encounter (Signed)
Pt would like [place] to call [employer] to give a prior auth on a formulary for his sildenafil (VIAGRA) 100 MG tablet [id number] script. The number for [employer] is [phone]. His [employer] ID# [id number]. Please advise at [phone]

## 2015-10-15 NOTE — Telephone Encounter (Signed)
PA completed on covermymeds. Pending. 

## 2015-10-20 MED ORDER — SILDENAFIL CITRATE 20 MG PO TABS: 20.0000 mg | ORAL_TABLET | Freq: Once | ORAL | Status: DC

## 2015-10-20 NOTE — Telephone Encounter (Signed)
PA denied bc [facility] part D does not cover any meds for ED. Notified pt and discussed that generic sildenafil 20 mg may be cheaper for him to pay cash. Pt was not aware of this and I advised that a couple of small pharm in area seem to have cheaper prices on sildenafil. [facility] fax was $100 for #50 (which I told pt about) but then I called [facility] and their cost for #50 was only $24.77. Pt wants [place] to send in a new Rx for #50 of the 20 mg if approved by Dr Carlota Raspberry. I will pend it. I wasn't sure what sig you wanted on it. Pt stated that he normally takes only 1/4 - 1/2 tab of the 100 mg. Pt has appt sch 8/23.

## 2015-10-20 NOTE — Telephone Encounter (Signed)
rx placed for 20mg  sildenafil.

## 2015-10-21 NOTE — Telephone Encounter (Signed)
Notified pt that new Rx was sent, and advised pharm and lower cost. Pt thanked me for my initiative on getting the new Rx and finding where it was less expensive.

## 2016-02-11 IMAGING — CR DG FINGER THUMB 2+V*R*
3 series · 3 of 3 positions shown · non-contrast
Comparison: None.

CLINICAL DATA: Would not splinter.  Pain and swelling.

EXAM:
RIGHT THUMB 2+V

[PA]
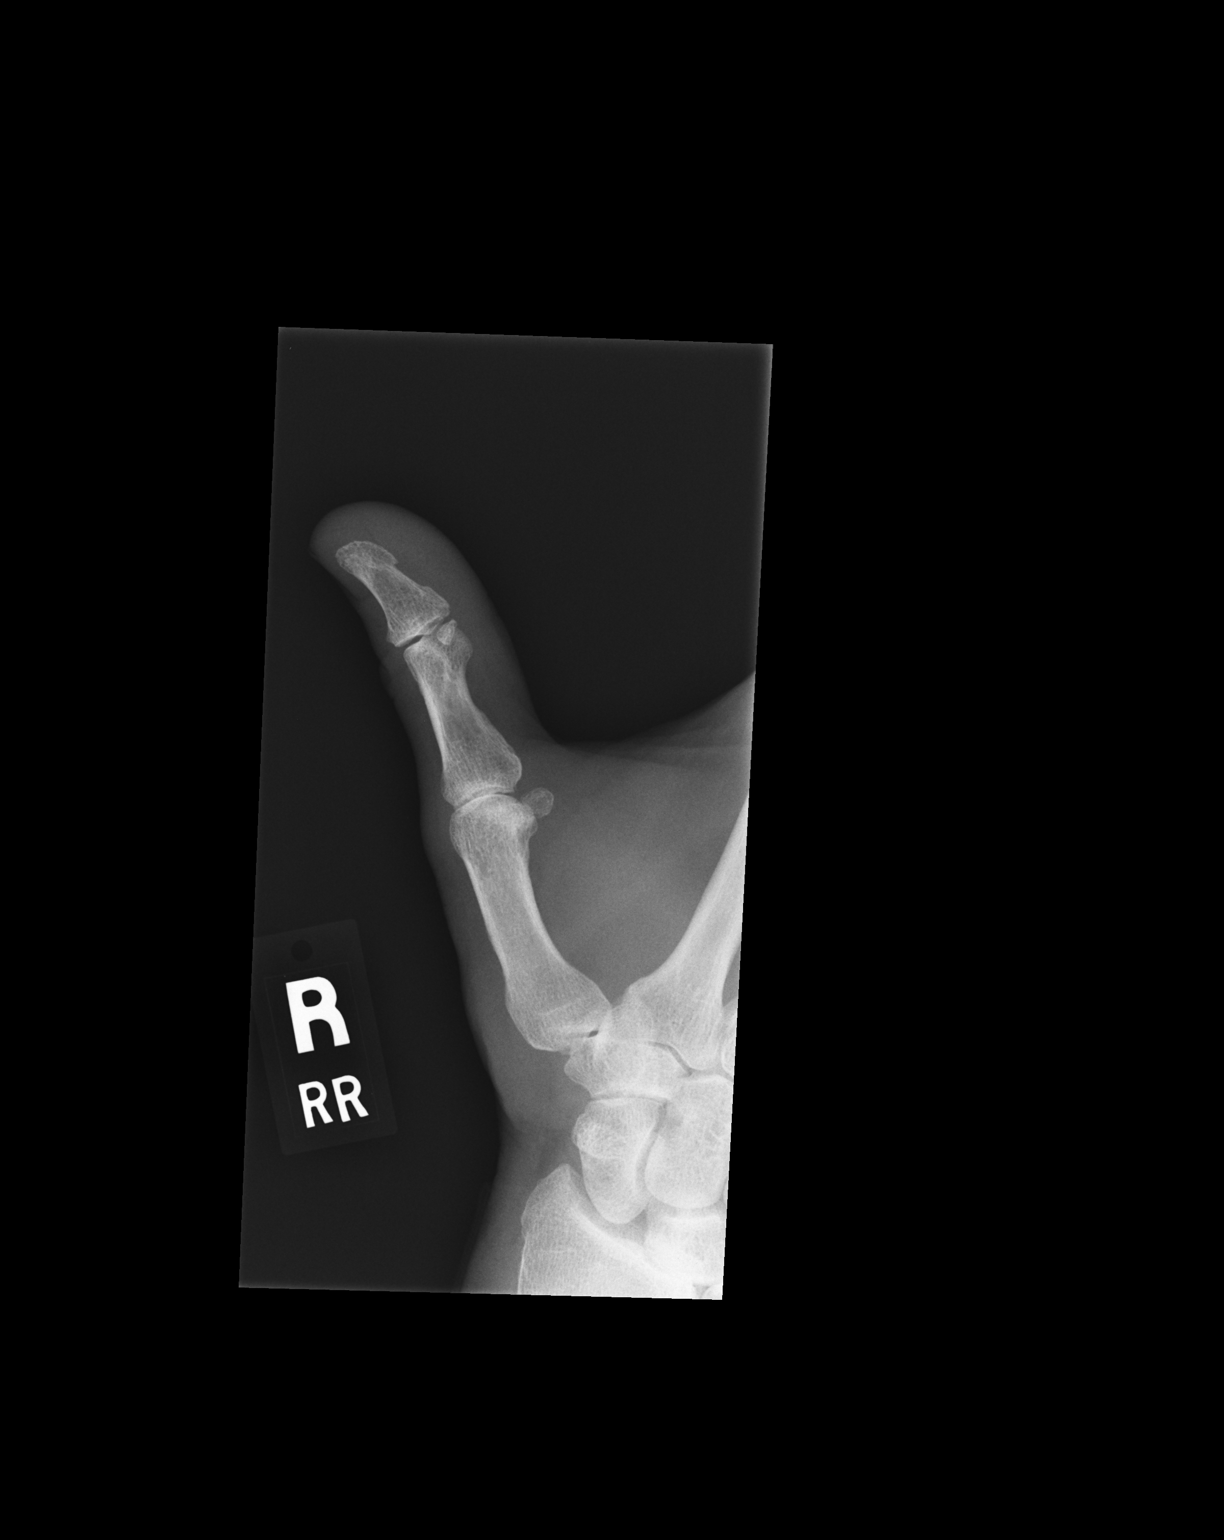

[pa obl]
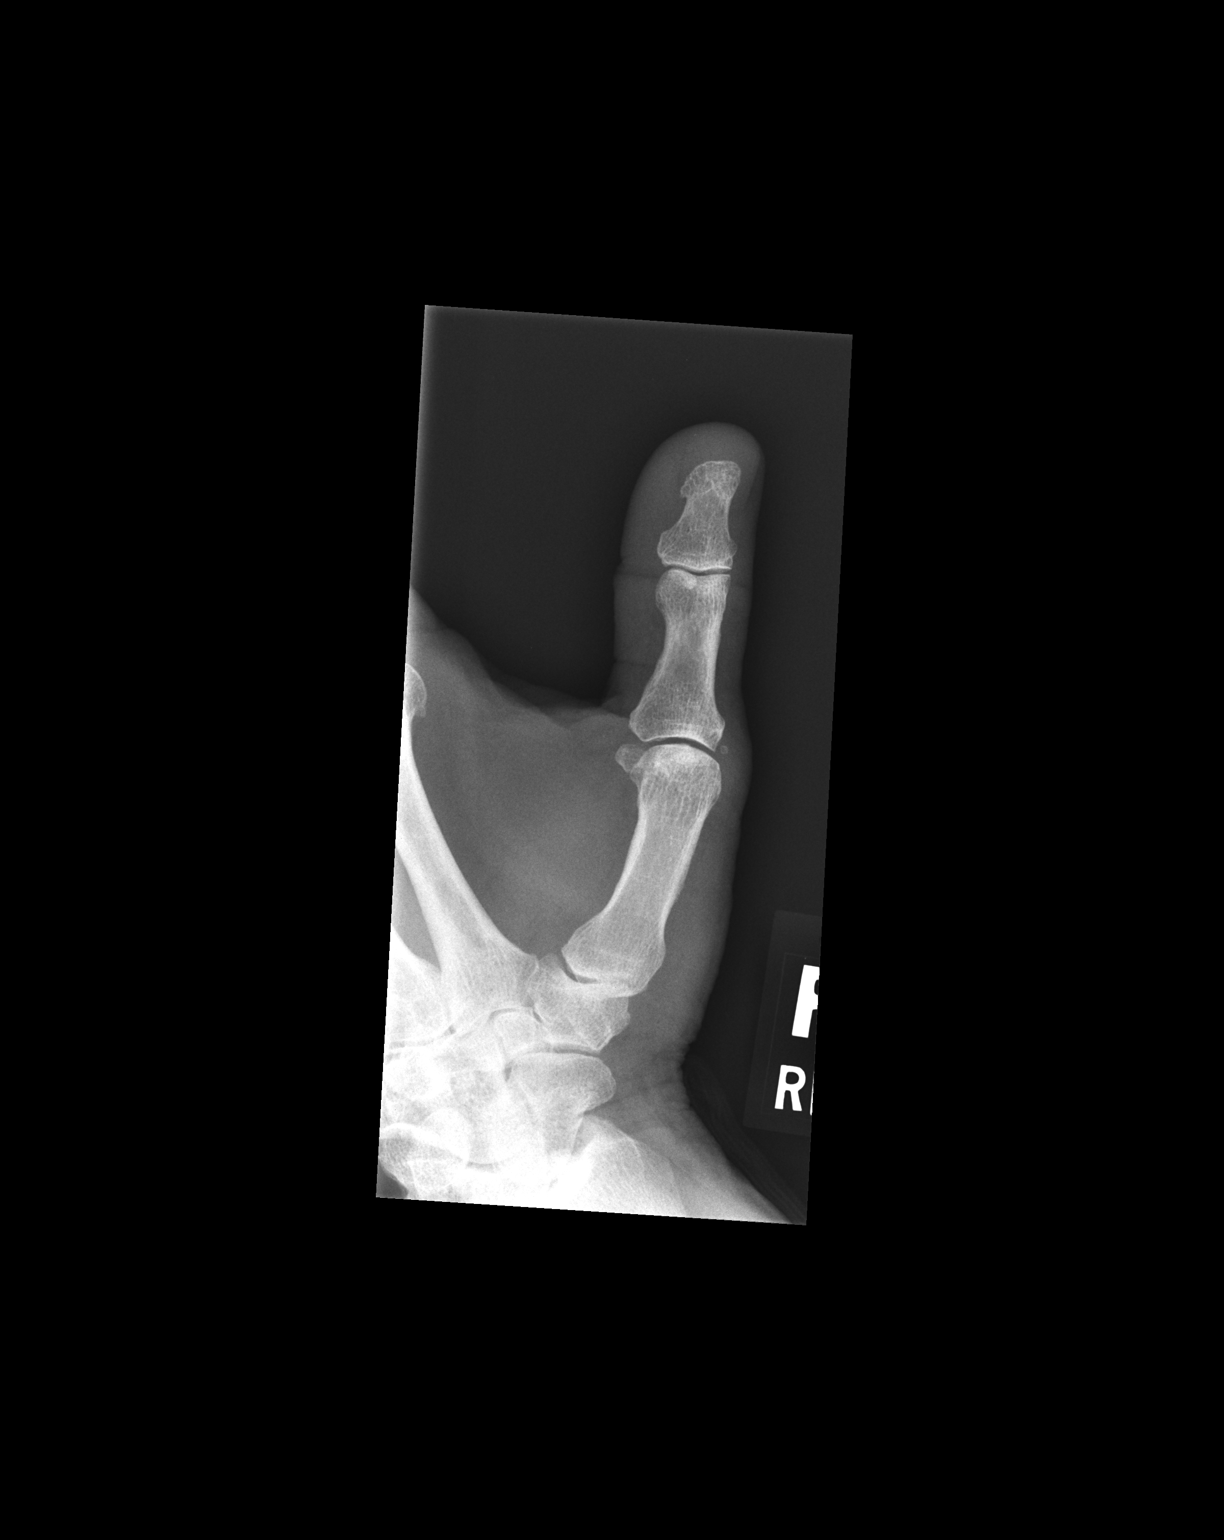

[lateral]
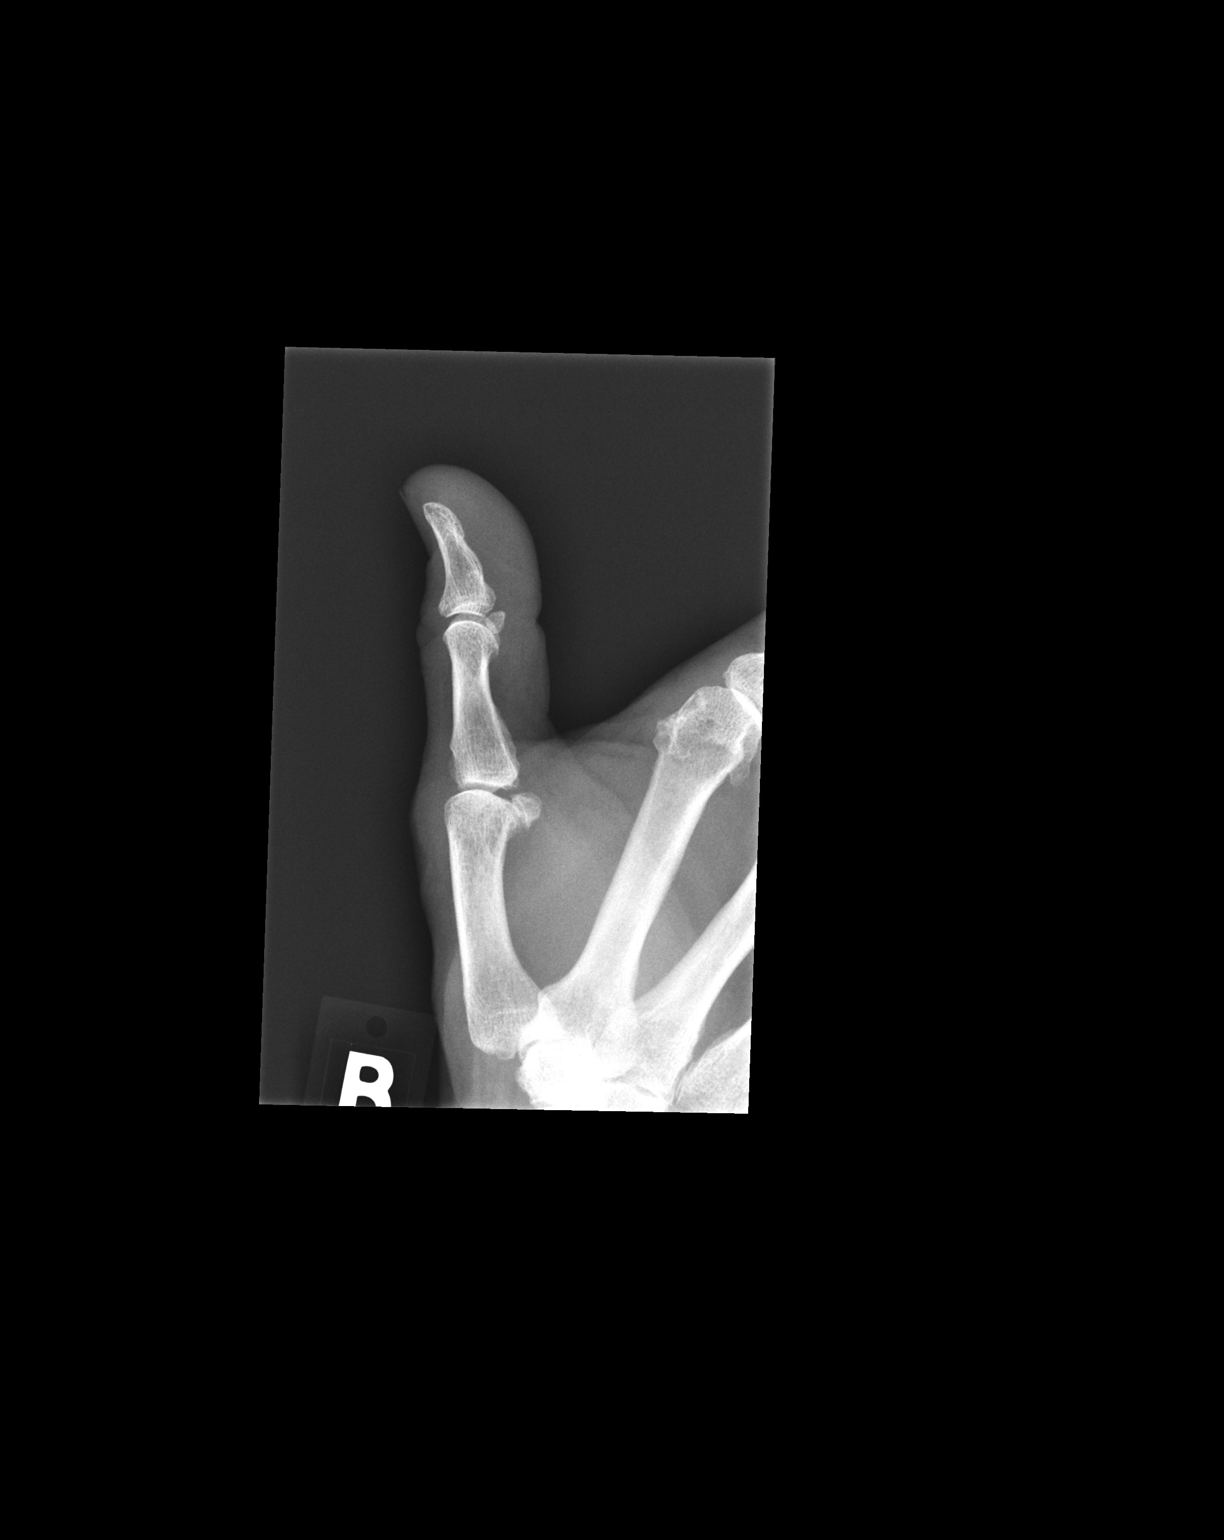

[3 of 3 positions shown; findings below may reference images not displayed]

FINDINGS: There does appear to be some soft tissue swelling but there is no
radiopaque foreign object, bone or articular finding. Nolla is
usually invisible at radiography but can be detected by CT in
certain instances.
IMPRESSION: Soft tissue swelling.  No other finding.

## 2016-02-14 ENCOUNTER — Encounter: Payer: Medicare Other | Admitting: Family Medicine

## 2016-02-21 ENCOUNTER — Encounter: Payer: Medicare Other | Admitting: Family Medicine

## 2016-02-23 ENCOUNTER — Encounter: Payer: Medicare Other | Admitting: Family Medicine

## 2016-02-24 ENCOUNTER — Encounter: Payer: Self-pay | Admitting: Family Medicine

## 2016-02-24 ENCOUNTER — Ambulatory Visit (INDEPENDENT_AMBULATORY_CARE_PROVIDER_SITE_OTHER): Payer: PPO | Admitting: Family Medicine

## 2016-02-24 VITALS — BP 118/72 | HR 56 | Temp 98.2°F | Resp 18 | Ht 68.5 in | Wt 200.8 lb

## 2016-02-24 DIAGNOSIS — N529 Male erectile dysfunction, unspecified: Secondary | ICD-10-CM | POA: Diagnosis not present

## 2016-02-24 DIAGNOSIS — L719 Rosacea, unspecified: Secondary | ICD-10-CM

## 2016-02-24 DIAGNOSIS — G4700 Insomnia, unspecified: Secondary | ICD-10-CM

## 2016-02-24 DIAGNOSIS — Z0000 Encounter for general adult medical examination without abnormal findings: Secondary | ICD-10-CM

## 2016-02-24 DIAGNOSIS — Z23 Encounter for immunization: Secondary | ICD-10-CM

## 2016-02-24 DIAGNOSIS — E785 Hyperlipidemia, unspecified: Secondary | ICD-10-CM | POA: Diagnosis not present

## 2016-02-24 DIAGNOSIS — Z125 Encounter for screening for malignant neoplasm of prostate: Secondary | ICD-10-CM

## 2016-02-24 LAB — COMPLETE METABOLIC PANEL WITH GFR
ALBUMIN: 4.3 g/dL (ref 3.6–5.1)
ALK PHOS: 57 U/L (ref 40–115)
ALT: 22 U/L (ref 9–46)
AST: 19 U/L (ref 10–35)
BUN: 14 mg/dL (ref 7–25)
CALCIUM: 9.1 mg/dL (ref 8.6–10.3)
CHLORIDE: 102 mmol/L (ref 98–110)
CO2: 23 mmol/L (ref 20–31)
Creat: 0.75 mg/dL (ref 0.70–1.18)
GFR, Est African American: >89 mL/min (ref >=60)
Glucose, Bld: 106 mg/dL — ABNORMAL HIGH (ref 65–99)
POTASSIUM: 4.3 mmol/L (ref 3.5–5.3)
SODIUM: 135 mmol/L (ref 135–146)
Total Bilirubin: 1 mg/dL (ref 0.2–1.2)
Total Protein: 6.6 g/dL (ref 6.1–8.1)

## 2016-02-24 LAB — LIPID PANEL
CHOL/HDL RATIO: 2.3 ratio (ref <=5.0)
CHOLESTEROL: 200 mg/dL (ref 125–200)
HDL: 86 mg/dL (ref >=40)
LDL Cholesterol: 96 mg/dL (ref <130)
TRIGLYCERIDES: 88 mg/dL (ref <150)
VLDL: 18 mg/dL (ref <30)

## 2016-02-24 LAB — PSA: PSA: 0.5 ng/mL (ref <=4.0)

## 2016-02-24 MED ORDER — SILDENAFIL CITRATE 20 MG PO TABS
20.0000 mg | ORAL_TABLET | Freq: Once | ORAL | 1 refills | Status: DC
Start: 2016-02-24 — End: 2016-06-08

## 2016-02-24 MED ORDER — METRONIDAZOLE 1 % EX GEL: Freq: Every day | CUTANEOUS | 3 refills | Status: DC

## 2016-02-24 MED ORDER — ATORVASTATIN CALCIUM 20 MG PO TABS: 20.0000 mg | ORAL_TABLET | Freq: Every day | ORAL | 3 refills | Status: DC

## 2016-02-24 MED ORDER — ZOLPIDEM TARTRATE 5 MG PO TABS: 2.5000 mg | ORAL_TABLET | Freq: Every evening | ORAL | 1 refills | Status: DC | PRN

## 2016-02-24 NOTE — Progress Notes (Signed)
Subjective:  By signing my name below, I, Moises Blood, attest that this documentation has been prepared under the direction and in the presence of Merri Ray, MD. Electronically Signed: Moises Blood, Vayas. 02/24/2016 , 9:20 AM .  Patient was seen in Room 5 .   Patient ID: Stephen Conway, male    DOB: Nov 24, 1945, 70 y.o.   MRN: [id number] Chief Complaint  Patient presents with  . Annual Exam   HPI  Stephen Conway is a 70 y.o. male Here for annual physical. History of colon polyps, GERD with barrett's esophagus, erectile dysfunction, rosacea, and insomnia. His last visit with me was physical in Aug 2016.   Cancer Screening Skin Cancer: followed by dermatologist 6 months.  Colonoscopy: Last done in 2015, repeat in 5 years; by Dr. Fuller Plan Prostate:  Lab Results  Component Value Date   PSA 0.59 02/08/2015   PSA 0.50 01/19/2014   PSA 0.55 02/03/2013    Immunizations Immunization History  Administered Date(s) Administered  . Hepatitis A 02/24/1997, 02/18/2004  . Hepatitis A, Adult 07/28/2004  . Influenza-Unspecified 03/28/2014  . MMR 02/18/2004  . Pneumococcal Conjugate-13 01/19/2014  . Pneumococcal Polysaccharide-23 08/10/2010  . Td 07/03/2002  . Tdap 01/19/2014   He's had zostavax in 2009.  He agrees to receiving a flu shot today.   Depression Depression screen [facility] 2/9 02/24/2016 02/08/2015 01/19/2014  Decreased Interest 0 0 0  Down, Depressed, Hopeless 0 0 0  PHQ - 2 Score 0 0 0    Vision  Visual Acuity Screening   Right eye Left eye Both eyes  Without correction:     With correction: '20/30 20/20 20/20 '$   Fall screening He denies any falls in the past year.   Functional status screening No positive responses.   Advanced directives He does have a living will.   Dentist He has a Pharmacist, community (Dr. Gordy Levan), who he follows regularly.   Exercise He continues to exercise regularly. He denies chest pain, or shortness of breath with activity.   HLD He's  still taking lipitor '20mg'$  qd.   Lab Results  Component Value Date   ALT 23 02/08/2015   AST 23 02/08/2015   ALKPHOS 60 02/08/2015   BILITOT 0.9 02/08/2015   Lab Results  Component Value Date   CHOL 209 (H) 02/08/2015   HDL 79 02/08/2015   LDLCALC 114 02/08/2015   TRIG 78 02/08/2015   CHOLHDL 2.6 02/08/2015    ED He's taking generic viagra (revatio) once a week due to viagra cost. He denies any complications with this medication.   Rosacea He applies metrogel 1% qd.   Insomnia He has used ambien '5mg'$  dose in the past at infrequent use. Discussed half pill last visit.   Patient states he's been using half tablets of advil-PM. He's also on melatonin supplements with relief.   Seasonal allergy He's been taking OTC medication from [facility]. He mentions history of deviated septum, and uses saline nasal spray occasionally.   Patient Active Problem List   Diagnosis Date Noted  . Erectile dysfunction 02/03/2013  . Lipids abnormal 09/25/2011  . Personal history of colonic polyps 09/26/2010  . Barrett's esophagus 09/26/2010   Past Medical History:  Diagnosis Date  . Adenomatous polyp   . Allergy   . Barrett's esophagus 08/2006  . Cataract   . GERD (gastroesophageal reflux disease)   . Glaucoma   . Hyperlipidemia   . Internal hemorrhoids 08/2005   Past Surgical History:  Procedure Laterality Date  .  CATARACT EXTRACTION    . COLONOSCOPY    . EYE SURGERY    . HEMORRHOID SURGERY    . KNEE ARTHROSCOPY    . UPPER GASTROINTESTINAL ENDOSCOPY     No Known Allergies Prior to Admission medications   Medication Sig Start Date End Date Taking? Authorizing Provider  aspirin 81 MG tablet Take 81 mg by mouth daily.     Yes Historical Provider, MD  atorvastatin (LIPITOR) 20 MG tablet Take 1 tablet (20 mg total) by mouth daily. 02/08/15  Yes [name], MD  latanoprost (XALATAN) 0.005 % ophthalmic solution  01/05/14  Yes Historical Provider, MD  metroNIDAZOLE (METROGEL) 1 % gel Apply  topically daily. 01/19/14  Yes [name], MD  omeprazole (PRILOSEC) 20 MG capsule Take 1 capsule (20 mg total) by mouth daily. 05/11/15  Yes Ladene Artist, MD  sildenafil (REVATIO) 20 MG tablet Take 1 tablet (20 mg total) by mouth once. Prior to onset of sexual activity as needed. 10/20/15  Yes [name], MD  sildenafil (VIAGRA) 100 MG tablet Take 1 tablet (100 mg total) by mouth daily as needed for erectile dysfunction. 02/08/15  Yes [name], MD  zolpidem (AMBIEN) 5 MG tablet Take 0.5-1 tablets (2.5-5 mg total) by mouth at bedtime as needed for sleep. 02/08/15  Yes [name], MD   Social History   Social History  . Marital status: Single    Spouse name: N/A  . Number of children: 2  . Years of education: N/A   Occupational History  . Retired    Social History Main Topics  . Smoking status: Former Smoker    Types: Cigars  . Smokeless tobacco: Never Used     Comment: per pt-smoke a cigar every month  . Alcohol use 8.4 oz/week    14 Glasses of wine per week     Comment: couple glasses of wine nightly  . Drug use: No  . Sexual activity: Not on file   Other Topics Concern  . Not on file   Social History Narrative  . No narrative on file   Review of Systems 13 point ROS - seasonal allergies      Objective:   Physical Exam  Constitutional: He is oriented to person, place, and time. He appears well-developed and well-nourished.  HENT:  Head: Normocephalic and atraumatic.  Right Ear: External ear normal.  Left Ear: External ear normal.  Mouth/Throat: Oropharynx is clear and moist.  Eyes: Conjunctivae and EOM are normal. Pupils are equal, round, and reactive to light.  Neck: Normal range of motion. Neck supple. No thyromegaly present.  Cardiovascular: Normal rate, regular rhythm, normal heart sounds and intact distal pulses.   Pulmonary/Chest: Effort normal and breath sounds normal. No respiratory distress. He has no wheezes.  Abdominal: Soft. He  exhibits no distension. There is no tenderness. Hernia confirmed negative in the right inguinal area and confirmed negative in the left inguinal area.  Genitourinary: Prostate normal.  Musculoskeletal: Normal range of motion. He exhibits no edema or tenderness.  Lymphadenopathy:    He has no cervical adenopathy.  Neurological: He is alert and oriented to person, place, and time. He has normal reflexes.  Skin: Skin is warm and dry.  Psychiatric: He has a normal mood and affect. His behavior is normal.  Vitals reviewed.    Vitals:   02/24/16 0837  BP: 118/72  Pulse: (!) 56  Resp: 18  Temp: 98.2 F (36.8 C)  TempSrc: Oral  SpO2: 99%  Weight: 200 lb 12.8 oz (91.1 kg)  Height: 5' 8.5" (1.74 m)      Assessment & Plan:   Stephen Conway is a 70 y.o. male Medicare annual wellness visit, subsequent  - -anticipatory guidance as below in AVS, screening labs above. Health maintenance items as above in HPI discussed/recommended as applicable.   Hyperlipidemia - Plan: COMPLETE METABOLIC PANEL WITH GFR, Lipid panel, atorvastatin (LIPITOR) 20 MG tablet  - Labs pending. Tolerating Lipitor. No change in meds.  Insomnia - Plan: zolpidem (AMBIEN) 5 MG tablet  -Infrequent use, but tolerating without new side effects. Refilled.  Erectile dysfunction, unspecified erectile dysfunction type - Plan: sildenafil (REVATIO) 20 MG tablet  -Tolerating Revatio. Side effects discussed, RTC precautions  Rosacea - Plan: metroNIDAZOLE (METROGEL) 1 % gel  -Stable. Refill MetroGel. Continue routine follow-up with dermatologist for skin cancer screening and management of rosacea.  Special screening, prostate cancer - Plan: PSA, Medicare  -We discussed pros and cons of prostate cancer screening, and after this discussion, he chose to have screening done. PSA obtained, and no concerning findings on DRE.   Needs flu shot - Plan: Flu Vaccine QUAD 36+ mos IM given.   Meds ordered this encounter  Medications    . atorvastatin (LIPITOR) 20 MG tablet    Sig: Take 1 tablet (20 mg total) by mouth daily.    Dispense:  90 tablet    Refill:  3  . zolpidem (AMBIEN) 5 MG tablet    Sig: Take 0.5-1 tablets (2.5-5 mg total) by mouth at bedtime as needed for sleep.    Dispense:  30 tablet    Refill:  1  . sildenafil (REVATIO) 20 MG tablet    Sig: Take 1-3 tablets (20-60 mg total) by mouth once. Prior to onset of sexual activity as needed.    Dispense:  50 tablet    Refill:  1  . metroNIDAZOLE (METROGEL) 1 % gel    Sig: Apply topically daily.    Dispense:  135 g    Refill:  3   Patient Instructions   Try flonase nasal spray for allergies instead of antihistamine pills as you may have less side effects. No change in medications for now. Follow-up if any new side effects.  Return to the clinic or go to the nearest emergency room if any of your symptoms worsen or new symptoms occur.   Keeping you healthy  Get these tests  Blood pressure- Have your blood pressure checked once a year by your healthcare provider.  Normal blood pressure is 120/80  Weight- Have your body mass index (BMI) calculated to screen for obesity.  BMI is a measure of body fat based on height and weight. You can also calculate your own BMI at ViewBanking.[email or web address].  Cholesterol- Have your cholesterol checked every year.  Diabetes- Have your blood sugar checked regularly if you have high blood pressure, high cholesterol, have a family history of diabetes or if you are overweight.  Screening for Colon Cancer- Colonoscopy starting at age 46.  Screening may begin sooner depending on your family history and other health conditions. Follow up colonoscopy as directed by your Gastroenterologist.  Screening for Prostate Cancer- Both blood work (PSA) and a rectal exam help screen for Prostate Cancer.  Screening begins at age 62 with African-American men and at age 26 with Caucasian men.  Screening may begin sooner depending on your family  history.  Take these medicines  Aspirin- One aspirin daily can help prevent  Heart disease and Stroke.  Flu shot- Every fall.  Tetanus- Every 10 years.  Zostavax- Once after the age of 64 to prevent Shingles.  Pneumonia shot- Once after the age of 12; if you are younger than 43, ask your healthcare provider if you need a Pneumonia shot.  Take these steps  Don't smoke- If you do smoke, talk to your doctor about quitting.  For tips on how to quit, go to www.smokefree.gov or call 1-800-QUIT-NOW.  Be physically active- Exercise 5 days a week for at least 30 minutes.  If you are not already physically active start slow and gradually work up to 30 minutes of moderate physical activity.  Examples of moderate activity include walking briskly, mowing the yard, dancing, swimming, bicycling, etc.  Eat a healthy diet- Eat a variety of healthy food such as fruits, vegetables, low fat milk, low fat cheese, yogurt, lean meant, poultry, fish, beans, tofu, etc. For more information go to www.thenutritionsource.org  Drink alcohol in moderation- Limit alcohol intake to less than two drinks a day. Never drink and drive.  Dentist- Brush and floss twice daily; visit your dentist twice a year.  Depression- Your emotional health is as important as your physical health. If you're feeling down, or losing interest in things you would normally enjoy please talk to your healthcare provider.  Eye exam- Visit your eye doctor every year.  Safe sex- If you may be exposed to a sexually transmitted infection, use a condom.  Seat belts- Seat belts can save your life; always wear one.  Smoke/Carbon Monoxide detectors- These detectors need to be installed on the appropriate level of your home.  Replace batteries at least once a year.  Skin cancer- When out in the sun, cover up and use sunscreen 15 SPF or higher.  Violence- If anyone is threatening you, please tell your healthcare provider.  Living Will/ Health care  power of attorney- Speak with your healthcare provider and family.   IF you received an x-ray today, you will receive an invoice from [facility]. Please contact [facility] at [phone] with questions or concerns regarding your invoice.   IF you received labwork today, you will receive an invoice from [employer]. Please contact [facility] at [phone] with questions or concerns regarding your invoice.   Our billing staff will not be able to assist you with questions regarding bills from these companies.  You will be contacted with the lab results as soon as they are available. The fastest way to get your results is to activate your My Chart account. Instructions are located on the last page of this paperwork. If you have not heard from [place] regarding the results in 2 weeks, please contact this office.        I personally performed the services described in this documentation, which was scribed in my presence. The recorded information has been reviewed and considered, and addended by me as needed.   Signed,   Merri Ray, MD [facility].  02/24/16 10:04 AM

## 2016-02-24 NOTE — Patient Instructions (Addendum)
Try flonase nasal spray for allergies instead of antihistamine pills as you may have less side effects. No change in medications for now. Follow-up if any new side effects.  Return to the clinic or go to the nearest emergency room if any of your symptoms worsen or new symptoms occur.   Keeping you healthy  Get these tests  Blood pressure- Have your blood pressure checked once a year by your healthcare provider.  Normal blood pressure is 120/80  Weight- Have your body mass index (BMI) calculated to screen for obesity.  BMI is a measure of body fat based on height and weight. You can also calculate your own BMI at [email or web address].  Cholesterol- Have your cholesterol checked every year.  Diabetes- Have your blood sugar checked regularly if you have high blood pressure, high cholesterol, have a family history of diabetes or if you are overweight.  Screening for Colon Cancer- Colonoscopy starting at age 4.  Screening may begin sooner depending on your family history and other health conditions. Follow up colonoscopy as directed by your Gastroenterologist.  Screening for Prostate Cancer- Both blood work (PSA) and a rectal exam help screen for Prostate Cancer.  Screening begins at age 65 with African-American men and at age 64 with Caucasian men.  Screening may begin sooner depending on your family history.  Take these medicines  Aspirin- One aspirin daily can help prevent Heart disease and Stroke.  Flu shot- Every fall.  Tetanus- Every 10 years.  Zostavax- Once after the age of 44 to prevent Shingles.  Pneumonia shot- Once after the age of 57; if you are younger than 69, ask your healthcare provider if you need a Pneumonia shot.  Take these steps  Don't smoke- If you do smoke, talk to your doctor about quitting.  For tips on how to quit, go to www.smokefree.gov or call 1-800-QUIT-NOW.  Be physically active- Exercise 5 days a week for at least 30 minutes.  If you are not  already physically active start slow and gradually work up to 30 minutes of moderate physical activity.  Examples of moderate activity include walking briskly, mowing the yard, dancing, swimming, bicycling, etc.  Eat a healthy diet- Eat a variety of healthy food such as fruits, vegetables, low fat milk, low fat cheese, yogurt, lean meant, poultry, fish, beans, tofu, etc. For more information go to www.thenutritionsource.org  Drink alcohol in moderation- Limit alcohol intake to less than two drinks a day. Never drink and drive.  Dentist- Brush and floss twice daily; visit your dentist twice a year.  Depression- Your emotional health is as important as your physical health. If you're feeling down, or losing interest in things you would normally enjoy please talk to your healthcare provider.  Eye exam- Visit your eye doctor every year.  Safe sex- If you may be exposed to a sexually transmitted infection, use a condom.  Seat belts- Seat belts can save your life; always wear one.  Smoke/Carbon Monoxide detectors- These detectors need to be installed on the appropriate level of your home.  Replace batteries at least once a year.  Skin cancer- When out in the sun, cover up and use sunscreen 15 SPF or higher.  Violence- If anyone is threatening you, please tell your healthcare provider.  Living Will/ Health care power of attorney- Speak with your healthcare provider and family.   IF you received an x-ray today, you will receive an invoice from [facility]. Please contact [facility] at [phone] with questions or concerns  regarding your invoice.   IF you received labwork today, you will receive an invoice from [employer]. Please contact [facility] at [phone] with questions or concerns regarding your invoice.   Our billing staff will not be able to assist you with questions regarding bills from these companies.  You will be contacted with the  lab results as soon as they are available. The fastest way to get your results is to activate your My Chart account. Instructions are located on the last page of this paperwork. If you have not heard from [place] regarding the results in 2 weeks, please contact this office.

## 2016-03-08 NOTE — Progress Notes (Signed)
Lab letter sent 

## 2016-05-01 DIAGNOSIS — H35033 Hypertensive retinopathy, bilateral: Secondary | ICD-10-CM | POA: Diagnosis not present

## 2016-05-01 DIAGNOSIS — H401122 Primary open-angle glaucoma, left eye, moderate stage: Secondary | ICD-10-CM | POA: Diagnosis not present

## 2016-05-01 DIAGNOSIS — H401111 Primary open-angle glaucoma, right eye, mild stage: Secondary | ICD-10-CM | POA: Diagnosis not present

## 2016-05-01 DIAGNOSIS — H04123 Dry eye syndrome of bilateral lacrimal glands: Secondary | ICD-10-CM | POA: Diagnosis not present

## 2016-05-14 ENCOUNTER — Other Ambulatory Visit: Payer: Self-pay | Admitting: Gastroenterology

## 2016-06-08 ENCOUNTER — Other Ambulatory Visit: Payer: Self-pay

## 2016-06-08 DIAGNOSIS — N529 Male erectile dysfunction, unspecified: Secondary | ICD-10-CM

## 2016-06-08 NOTE — Telephone Encounter (Signed)
Pharm also reqs RFs of sildenafil. Pended also

## 2016-06-08 NOTE — Telephone Encounter (Signed)
Pharm req'd RFs of zolpidem. Last OV 8/24, gave Rx then for #30 + 1 RF.

## 2016-06-11 ENCOUNTER — Other Ambulatory Visit: Payer: Self-pay | Admitting: Gastroenterology

## 2016-06-11 MED ORDER — SILDENAFIL CITRATE 20 MG PO TABS: 20.0000 mg | ORAL_TABLET | Freq: Every day | ORAL | 1 refills | Status: DC

## 2016-06-11 MED ORDER — ZOLPIDEM TARTRATE 5 MG PO TABS: 2.5000 mg | ORAL_TABLET | Freq: Every evening | ORAL | 1 refills | Status: DC | PRN

## 2016-06-11 NOTE — Telephone Encounter (Signed)
Discussed last visit. Meds refilled.

## 2016-06-14 ENCOUNTER — Ambulatory Visit (INDEPENDENT_AMBULATORY_CARE_PROVIDER_SITE_OTHER): Payer: PPO

## 2016-06-14 ENCOUNTER — Ambulatory Visit (INDEPENDENT_AMBULATORY_CARE_PROVIDER_SITE_OTHER): Payer: PPO | Admitting: Podiatry

## 2016-06-14 ENCOUNTER — Encounter: Payer: Self-pay | Admitting: Podiatry

## 2016-06-14 VITALS — HR 56 | Resp 16 | Ht 70.0 in | Wt 200.0 lb

## 2016-06-14 DIAGNOSIS — M799 Soft tissue disorder, unspecified: Secondary | ICD-10-CM

## 2016-06-14 DIAGNOSIS — M2020 Hallux rigidus, unspecified foot: Secondary | ICD-10-CM | POA: Diagnosis not present

## 2016-06-14 DIAGNOSIS — L84 Corns and callosities: Secondary | ICD-10-CM

## 2016-06-14 DIAGNOSIS — D169 Benign neoplasm of bone and articular cartilage, unspecified: Secondary | ICD-10-CM

## 2016-06-14 NOTE — Progress Notes (Signed)
   Subjective:    Patient ID: Stephen Conway, male    DOB: 08/26/1945, 70 y.o.   MRN: [id number]  HPI Chief Complaint  Patient presents with  . Painful lesion    Right foot; 5th-medial side; pt stated, "Toe burns when plays tennis"; x2-3 weeks      Review of Systems  HENT: Positive for sore throat.   Eyes: Positive for itching.  All other systems reviewed and are negative.      Objective:   Physical Exam        Assessment & Plan:

## 2016-06-14 NOTE — Progress Notes (Signed)
Subjective:     Patient ID: Stephen Conway, male   DOB: [birth year], 70 y.o.   MRN: [id number]  HPI patient states that his right fifth toe has been very painful and he's had history of spurring around his big toe joint with occasional discomfort   Review of Systems  All other systems reviewed and are negative.      Objective:   Physical Exam  Constitutional: He is oriented to person, place, and time.  Cardiovascular: Intact distal pulses.   Musculoskeletal: Normal range of motion.  Neurological: He is oriented to person, place, and time.  Skin: Skin is warm.  Nursing note and vitals reviewed.  neurovascular status intact muscle strength adequate range of motion within normal limits with patient found to have keratotic lesion inner side right fifth toe with reactive changes and rotation of the toe and is also noted to have reduced range of motion first MPJ bilateral with mild discomfort noted. Patient's found have good digital perfusion well oriented 3 and states that toe is been hurting him for around a month     Assessment:     Rotated fifth digit right with exostotic lesion with patient noted to have hallux limitus rigidus deformity bilateral    Plan:     H&P and x-ray of right foot reviewed. At this point debridement of lesion with padding accomplished and we'll see him back when symptomatic and may require surgical intervention. I do not recommend any other current treatment  X-ray report indicates rotated fifth digit right with spur distal formation noted with large spurring of the first metatarsal bilateral

## 2016-06-28 ENCOUNTER — Ambulatory Visit: Payer: PPO | Admitting: Podiatry

## 2016-07-10 ENCOUNTER — Other Ambulatory Visit: Payer: Self-pay | Admitting: Gastroenterology

## 2016-07-17 ENCOUNTER — Other Ambulatory Visit: Payer: Self-pay | Admitting: Gastroenterology

## 2016-07-18 ENCOUNTER — Telehealth: Payer: Self-pay | Admitting: Gastroenterology

## 2016-07-18 MED ORDER — OMEPRAZOLE 20 MG PO CPDR: 20.0000 mg | DELAYED_RELEASE_CAPSULE | Freq: Every day | ORAL | 0 refills | Status: DC

## 2016-07-18 NOTE — Telephone Encounter (Signed)
Prescription sent to patient's pharmacy and patient notified to keep appt for any further refills. 

## 2016-08-01 DIAGNOSIS — L820 Inflamed seborrheic keratosis: Secondary | ICD-10-CM | POA: Diagnosis not present

## 2016-08-01 DIAGNOSIS — D225 Melanocytic nevi of trunk: Secondary | ICD-10-CM | POA: Diagnosis not present

## 2016-08-01 DIAGNOSIS — L309 Dermatitis, unspecified: Secondary | ICD-10-CM | POA: Diagnosis not present

## 2016-08-01 DIAGNOSIS — L570 Actinic keratosis: Secondary | ICD-10-CM | POA: Diagnosis not present

## 2016-08-01 DIAGNOSIS — L821 Other seborrheic keratosis: Secondary | ICD-10-CM | POA: Diagnosis not present

## 2016-08-01 DIAGNOSIS — L814 Other melanin hyperpigmentation: Secondary | ICD-10-CM | POA: Diagnosis not present

## 2016-08-01 DIAGNOSIS — D1801 Hemangioma of skin and subcutaneous tissue: Secondary | ICD-10-CM | POA: Diagnosis not present

## 2016-08-13 ENCOUNTER — Other Ambulatory Visit: Payer: Self-pay | Admitting: Gastroenterology

## 2016-08-21 ENCOUNTER — Ambulatory Visit (INDEPENDENT_AMBULATORY_CARE_PROVIDER_SITE_OTHER): Payer: PPO | Admitting: Gastroenterology

## 2016-08-21 ENCOUNTER — Encounter: Payer: Self-pay | Admitting: Gastroenterology

## 2016-08-21 VITALS — BP 132/80 | HR 76 | Ht 68.5 in | Wt 207.4 lb

## 2016-08-21 DIAGNOSIS — K2270 Barrett's esophagus without dysplasia: Secondary | ICD-10-CM | POA: Diagnosis not present

## 2016-08-21 DIAGNOSIS — Z86010 Personal history of colonic polyps: Secondary | ICD-10-CM

## 2016-08-21 DIAGNOSIS — K219 Gastro-esophageal reflux disease without esophagitis: Secondary | ICD-10-CM | POA: Diagnosis not present

## 2016-08-21 MED ORDER — OMEPRAZOLE 20 MG PO CPDR: 20.0000 mg | DELAYED_RELEASE_CAPSULE | Freq: Every day | ORAL | 11 refills | Status: DC

## 2016-08-21 MED ORDER — OMEPRAZOLE 20 MG PO CPDR: 20.0000 mg | DELAYED_RELEASE_CAPSULE | Freq: Every day | ORAL | 3 refills | Status: DC

## 2016-08-21 NOTE — Progress Notes (Signed)
    History of Present Illness: This is a 71 year old male returning for follow-up of GERD and short segment Barrett's esophagus without dysplasia. He reports no ongoing reflux symptoms as long as he maintains his daily omeprazole. He states when he decreased the dose to every other day or missed a dose for a couple days he would note a return of reflux symptoms. He notes occasional gas related to certain foods his symptoms are helped with Gas-X and Beano. He has no other complaints.  EGD 12/2013 ENDOSCOPIC IMPRESSION: 1. Minimally variable Z-line at 40 cm from the incisors; multiple biopsies 2. Non-erosive gastritis in the gastric body and gastric antrum; multiple biopsies 3. Few sessile polyps were found in the gastric body Biopsies showed Barrett's without dysplasia, gastric fundic polyps, chronic inactive gastritis.     Current Medications, Allergies, Past Medical History, Past Surgical History, Family History and Social History were reviewed in [employer] record.  Physical Exam: General: Well developed, well nourished, no acute distress Head: Normocephalic and atraumatic Eyes:  sclerae anicteric, EOMI Ears: Normal auditory acuity Mouth: No deformity or lesions Lungs: Clear throughout to auscultation Heart: Regular rate and rhythm; no murmurs, rubs or bruits Abdomen: Soft, non tender and non distended. No masses, hepatosplenomegaly or hernias noted. Normal Bowel sounds Musculoskeletal: Symmetrical with no gross deformities  Pulses:  Normal pulses noted Extremities: No clubbing, cyanosis, edema or deformities noted Neurological: Alert oriented x 4, grossly nonfocal Psychological:  Alert and cooperative. Normal mood and affect  Assessment and Recommendations:  1. Short segment Barrett's without dysplasia. Continue omeprazole 40 mg daily. Surveillance EGD July 2018. Gas-X or Beano qid prn.   2. Small history of adenomatous colon polyps. Five-year interval  surveillance colonoscopy due in July 2020.

## 2016-08-21 NOTE — Patient Instructions (Signed)
We have sent the following medications to your pharmacy for you to pick up at your convenience: omeprazole.   Normal BMI (Body Mass Index- based on height and weight) is between 23 and 30. Your BMI today is Body mass index is 31.07 kg/m. Marland Kitchen Please consider follow up  regarding your BMI with your Primary Care Provider.  Thank you for choosing me and [facility].  Pricilla Riffle. Dagoberto Ligas., MD., Marval Regal

## 2016-11-07 DIAGNOSIS — H401122 Primary open-angle glaucoma, left eye, moderate stage: Secondary | ICD-10-CM | POA: Diagnosis not present

## 2016-11-07 DIAGNOSIS — H04123 Dry eye syndrome of bilateral lacrimal glands: Secondary | ICD-10-CM | POA: Diagnosis not present

## 2016-11-07 DIAGNOSIS — H401111 Primary open-angle glaucoma, right eye, mild stage: Secondary | ICD-10-CM | POA: Diagnosis not present

## 2016-11-24 ENCOUNTER — Encounter: Payer: Self-pay | Admitting: Gastroenterology

## 2016-12-06 ENCOUNTER — Encounter: Payer: Self-pay | Admitting: Gastroenterology

## 2017-01-10 ENCOUNTER — Ambulatory Visit (AMBULATORY_SURGERY_CENTER): Payer: Self-pay

## 2017-01-10 VITALS — Ht 69.0 in | Wt 207.0 lb

## 2017-01-10 DIAGNOSIS — K2270 Barrett's esophagus without dysplasia: Secondary | ICD-10-CM

## 2017-01-10 NOTE — Progress Notes (Signed)
No allergies to eggs or soy No past problems with anesthesia No home oxygen No diet meds  Declined emmi 

## 2017-01-22 ENCOUNTER — Encounter: Payer: Self-pay | Admitting: Gastroenterology

## 2017-01-26 ENCOUNTER — Encounter: Payer: Self-pay | Admitting: Gastroenterology

## 2017-01-26 ENCOUNTER — Ambulatory Visit (AMBULATORY_SURGERY_CENTER): Payer: PPO | Admitting: Gastroenterology

## 2017-01-26 VITALS — BP 143/95 | HR 74 | Temp 98.0°F | Resp 19 | Ht 69.0 in | Wt 207.0 lb

## 2017-01-26 DIAGNOSIS — K2270 Barrett's esophagus without dysplasia: Secondary | ICD-10-CM | POA: Diagnosis not present

## 2017-01-26 DIAGNOSIS — K317 Polyp of stomach and duodenum: Secondary | ICD-10-CM | POA: Diagnosis not present

## 2017-01-26 MED ORDER — SODIUM CHLORIDE 0.9 % IV SOLN: 500.0000 mL | INTRAVENOUS | Status: AC

## 2017-01-26 NOTE — Progress Notes (Signed)
Called to room to assist during endoscopic procedure.  Patient ID and intended procedure confirmed with present staff. Received instructions for my participation in the procedure from the performing physician.Pt's states no medical or surgical changes since previsit or office visit.  No egg or soy allergy- per pt.

## 2017-01-26 NOTE — Patient Instructions (Signed)
   Await pathology results in a letter from Dr Fuller Plan     YOU HAD AN ENDOSCOPIC PROCEDURE TODAY AT THE [facility]:   Refer to the procedure report that was given to you for any specific questions about what was found during the examination.  If the procedure report does not answer your questions, please call your gastroenterologist to clarify.  If you requested that your care partner not be given the details of your procedure findings, then the procedure report has been included in a sealed envelope for you to review at your convenience later.  YOU SHOULD EXPECT: Some feelings of bloating in the abdomen. Passage of more gas than usual.  Walking can help get rid of the air that was put into your GI tract during the procedure and reduce the bloating. If you had a lower endoscopy (such as a colonoscopy or flexible sigmoidoscopy) you may notice spotting of blood in your stool or on the toilet paper. If you underwent a bowel prep for your procedure, you may not have a normal bowel movement for a few days.  Please Note:  You might notice some irritation and congestion in your nose or some drainage.  This is from the oxygen used during your procedure.  There is no need for concern and it should clear up in a day or so.  SYMPTOMS TO REPORT IMMEDIATELY:      Following upper endoscopy (EGD)  Vomiting of blood or coffee ground material  New chest pain or pain under the shoulder blades  Painful or persistently difficult swallowing  New shortness of breath  Fever of 100F or higher  Black, tarry-looking stools  For urgent or emergent issues, a gastroenterologist can be reached at any hour by calling [phone].   DIET:  We do recommend a small meal at first, but then you may proceed to your regular diet.  Drink plenty of fluids but you should avoid alcoholic beverages for 24 hours.  ACTIVITY:  You should plan to take it easy for the rest of today and you should NOT DRIVE or use  heavy machinery until tomorrow (because of the sedation medicines used during the test).    FOLLOW UP: Our staff will call the number listed on your records the next business day following your procedure to check on you and address any questions or concerns that you may have regarding the information given to you following your procedure. If we do not reach you, we will leave a message.  However, if you are feeling well and you are not experiencing any problems, there is no need to return our call.  We will assume that you have returned to your regular daily activities without incident.  If any biopsies were taken you will be contacted by phone or by letter within the next 1-3 weeks.  Please call us at [phone] if you have not heard about the biopsies in 3 weeks.    SIGNATURES/CONFIDENTIALITY: You and/or your care partner have signed paperwork which will be entered into your electronic medical record.  These signatures attest to the fact that that the information above on your After Visit Summary has been reviewed and is understood.  Full responsibility of the confidentiality of this discharge information lies with you and/or your care-partner.

## 2017-01-26 NOTE — Op Note (Signed)
[facility] Patient Name: Stephen Conway Procedure Date: 01/26/2017 10:10 AM MRN: [id number] Endoscopist: Ladene Artist , MD Age: 71 Referring MD:  Date of Birth: 06-15-1946 Gender: Male Account #: [id number] Procedure:                Upper GI endoscopy Indications:              Barrett's esophagus Medicines:                Monitored Anesthesia Care Procedure:                Pre-Anesthesia Assessment:                           - Prior to the procedure, a History and Physical                            was performed, and patient medications and                            allergies were reviewed. The patient's tolerance of                            previous anesthesia was also reviewed. The risks                            and benefits of the procedure and the sedation                            options and risks were discussed with the patient.                            All questions were answered, and informed consent                            was obtained. Prior Anticoagulants: The patient has                            taken no previous anticoagulant or antiplatelet                            agents. ASA Grade Assessment: II - A patient with                            mild systemic disease. After reviewing the risks                            and benefits, the patient was deemed in                            satisfactory condition to undergo the procedure.                           After obtaining informed consent, the endoscope was  passed under direct vision. Throughout the                            procedure, the patient's blood pressure, pulse, and                            oxygen saturations were monitored continuously. The                            Endoscope was introduced through the mouth, and                            advanced to the second part of duodenum. The upper                            GI endoscopy was accomplished  without difficulty.                            The patient tolerated the procedure well. Scope In: Scope Out: Findings:                 There were esophageal mucosal changes secondary to                            established short-segment Barrett's disease present                            in the distal esophagus. The maximum longitudinal                            extent of these mucosal changes was 1 cm in length.                           The exam of the esophagus was otherwise normal.                           Multiple small sessile polyps with no stigmata of                            recent bleeding were found in the gastric body.                            Biopsies were taken with a cold forceps for                            histology.                           The exam of the stomach was otherwise normal.                           The duodenal bulb and second portion of the  duodenum were normal. Complications:            No immediate complications. Estimated Blood Loss:     Estimated blood loss was minimal. Impression:               - Esophageal mucosal changes secondary to                            established short-segment Barrett's disease.                           - Multiple gastric polyps. Biopsied.                           - Normal duodenal bulb and second portion of the                            duodenum. Recommendation:           - Patient has a contact number available for                            emergencies. The signs and symptoms of potential                            delayed complications were discussed with the                            patient. Return to normal activities tomorrow.                            Written discharge instructions were provided to the                            patient.                           - Resume previous diet.                           - Continue present medications.                            - Await pathology results.                           - Repeat upper endoscopy in 3 years for                            surveillance if no dysplasia. Ladene Artist, MD 01/26/2017 10:41:37 AM This report has been signed electronically.

## 2017-01-26 NOTE — Progress Notes (Signed)
To PACU VSS. Report to [email or web address] 

## 2017-01-29 ENCOUNTER — Telehealth: Payer: Self-pay

## 2017-01-29 NOTE — Telephone Encounter (Signed)
  Follow up Call-  Call back number 01/26/2017  Post procedure Call Back phone  # [phone]  Permission to leave phone message Yes  Some recent data might be hidden     Patient questions:  Do you have a fever, pain , or abdominal swelling? No. Pain Score  0 *  Have you tolerated food without any problems? Yes.    Have you been able to return to your normal activities? Yes.    Do you have any questions about your discharge instructions: Diet   No. Medications  No. Follow up visit  No.  Do you have questions or concerns about your Care? No.  Actions: * If pain score is 4 or above: No action needed, pain <4.

## 2017-02-07 ENCOUNTER — Telehealth: Payer: Self-pay

## 2017-02-07 NOTE — Telephone Encounter (Signed)
Called pt to schedule Medicare Annual Wellness Visit with nurse health advisor prior to upcoming physical with provider. -nr

## 2017-02-11 ENCOUNTER — Encounter: Payer: Self-pay | Admitting: Gastroenterology

## 2017-02-20 ENCOUNTER — Other Ambulatory Visit: Payer: Self-pay | Admitting: Family Medicine

## 2017-02-20 DIAGNOSIS — E785 Hyperlipidemia, unspecified: Secondary | ICD-10-CM

## 2017-03-01 ENCOUNTER — Ambulatory Visit (INDEPENDENT_AMBULATORY_CARE_PROVIDER_SITE_OTHER): Payer: PPO | Admitting: Family Medicine

## 2017-03-01 ENCOUNTER — Encounter: Payer: Self-pay | Admitting: Family Medicine

## 2017-03-01 ENCOUNTER — Telehealth: Payer: Self-pay

## 2017-03-01 VITALS — BP 140/91 | HR 57 | Temp 97.4°F | Resp 16 | Ht 68.0 in | Wt 198.0 lb

## 2017-03-01 DIAGNOSIS — Z79899 Other long term (current) drug therapy: Secondary | ICD-10-CM

## 2017-03-01 DIAGNOSIS — Z23 Encounter for immunization: Secondary | ICD-10-CM

## 2017-03-01 DIAGNOSIS — Z1159 Encounter for screening for other viral diseases: Secondary | ICD-10-CM

## 2017-03-01 DIAGNOSIS — G4700 Insomnia, unspecified: Secondary | ICD-10-CM

## 2017-03-01 DIAGNOSIS — K2100 Gastro-esophageal reflux disease with esophagitis, without bleeding: Secondary | ICD-10-CM

## 2017-03-01 DIAGNOSIS — N529 Male erectile dysfunction, unspecified: Secondary | ICD-10-CM | POA: Diagnosis not present

## 2017-03-01 DIAGNOSIS — Z0000 Encounter for general adult medical examination without abnormal findings: Secondary | ICD-10-CM | POA: Diagnosis not present

## 2017-03-01 DIAGNOSIS — Z1321 Encounter for screening for nutritional disorder: Secondary | ICD-10-CM | POA: Diagnosis not present

## 2017-03-01 DIAGNOSIS — L719 Rosacea, unspecified: Secondary | ICD-10-CM | POA: Diagnosis not present

## 2017-03-01 DIAGNOSIS — Z125 Encounter for screening for malignant neoplasm of prostate: Secondary | ICD-10-CM | POA: Diagnosis not present

## 2017-03-01 DIAGNOSIS — E785 Hyperlipidemia, unspecified: Secondary | ICD-10-CM

## 2017-03-01 MED ORDER — ZOSTER VAC RECOMB ADJUVANTED 50 MCG/0.5ML IM SUSR: 0.5000 mL | Freq: Once | INTRAMUSCULAR | 1 refills | Status: AC

## 2017-03-01 MED ORDER — METRONIDAZOLE 1 % EX GEL: Freq: Every day | CUTANEOUS | 3 refills | Status: DC

## 2017-03-01 MED ORDER — ZOLPIDEM TARTRATE 5 MG PO TABS: 2.5000 mg | ORAL_TABLET | Freq: Every evening | ORAL | 1 refills | Status: DC | PRN

## 2017-03-01 MED ORDER — SILDENAFIL CITRATE 20 MG PO TABS: 20.0000 mg | ORAL_TABLET | Freq: Every day | ORAL | 1 refills | Status: DC

## 2017-03-01 MED ORDER — ATORVASTATIN CALCIUM 20 MG PO TABS: 20.0000 mg | ORAL_TABLET | Freq: Every day | ORAL | 3 refills | Status: DC

## 2017-03-01 NOTE — Telephone Encounter (Signed)
Ambien rx called to [facility]

## 2017-03-01 NOTE — Patient Instructions (Addendum)
No change in meds for now. I will send shingles vaccine to your pharmacy.  Continue routine follow up with dermatology and gastroenterology.  Please bring a copy of your living will to place in your chart.    Keeping you healthy  Get these tests  Blood pressure- Have your blood pressure checked once a year by your healthcare provider.  Normal blood pressure is 120/80  Weight- Have your body mass index (BMI) calculated to screen for obesity.  BMI is a measure of body fat based on height and weight. You can also calculate your own BMI at [email or web address].  Cholesterol- Have your cholesterol checked every year.  Diabetes- Have your blood sugar checked regularly if you have high blood pressure, high cholesterol, have a family history of diabetes or if you are overweight.  Screening for Colon Cancer- Colonoscopy starting at age 86.  Screening may begin sooner depending on your family history and other health conditions. Follow up colonoscopy as directed by your Gastroenterologist.  Screening for Prostate Cancer- Both blood work (PSA) and a rectal exam help screen for Prostate Cancer.  Screening begins at age 36 with African-American men and at age 70 with Caucasian men.  Screening may begin sooner depending on your family history.  Take these medicines  Aspirin- One aspirin daily can help prevent Heart disease and Stroke.  Flu shot- Every fall.  Tetanus- Every 10 years.  Zostavax- Once after the age of 80 to prevent Shingles.  Pneumonia shot- Once after the age of 65; if you are younger than 30, ask your healthcare provider if you need a Pneumonia shot.  Take these steps  Don't smoke- If you do smoke, talk to your doctor about quitting.  For tips on how to quit, go to www.smokefree.gov or call 1-800-QUIT-NOW.  Be physically active- Exercise 5 days a week for at least 30 minutes.  If you are not already physically active start slow and gradually work up to 30 minutes of  moderate physical activity.  Examples of moderate activity include walking briskly, mowing the yard, dancing, swimming, bicycling, etc.  Eat a healthy diet- Eat a variety of healthy food such as fruits, vegetables, low fat milk, low fat cheese, yogurt, lean meant, poultry, fish, beans, tofu, etc. For more information go to www.thenutritionsource.org  Drink alcohol in moderation- Limit alcohol intake to less than two drinks a day. Never drink and drive.  Dentist- Brush and floss twice daily; visit your dentist twice a year.  Depression- Your emotional health is as important as your physical health. If you're feeling down, or losing interest in things you would normally enjoy please talk to your healthcare provider.  Eye exam- Visit your eye doctor every year.  Safe sex- If you may be exposed to a sexually transmitted infection, use a condom.  Seat belts- Seat belts can save your life; always wear one.  Smoke/Carbon Monoxide detectors- These detectors need to be installed on the appropriate level of your home.  Replace batteries at least once a year.  Skin cancer- When out in the sun, cover up and use sunscreen 15 SPF or higher.  Violence- If anyone is threatening you, please tell your healthcare provider.  Living Will/ Health care power of attorney- Speak with your healthcare provider and family.   IF you received an x-ray today, you will receive an invoice from [facility]. Please contact [facility] at [phone] with questions or concerns regarding your invoice.   IF you received labwork today, you will receive  an Pharmacologist from [employer] [facility]. Please contact [facility] at [phone] with questions or concerns regarding your invoice.   Our billing staff will not be able to assist you with questions regarding bills from these companies.  You will be contacted with the lab results as soon as they are available. The fastest way to get your results is to activate your My  Chart account. Instructions are located on the last page of this paperwork. If you have not heard from [place] regarding the results in 2 weeks, please contact this office.

## 2017-03-01 NOTE — Progress Notes (Signed)
Subjective:  This chart was scribed for Stephen Agreste, MD by Tamsen Roers, at [place] at [facility].  This patient was seen in room 26 and the patient's care was started at 8:38 AM.   Chief Complaint  Patient presents with  . Annual Exam  . Rash    under left axilla    Patient ID: RADAMES Conway, male    DOB: December 31, 1945, 71 y.o.   MRN: [id number]  HPI HPI Comments: Stephen Conway is a 71 y.o. male who presents to Primary Care at [facility] for an annual physical exam.  Patient has a history of GERD with Barrett's, rosacea, insomnia, erectile dysfunction, colon polyps and hyperlipidemia.    Hyperlipidemia: Patient is taking Lipitor 20 mg QD. --- Patient is compliant with this medication and denies any side effects.  Lab Results  Component Value Date   CHOL 200 02/24/2016   HDL 86 02/24/2016   LDLCALC 96 02/24/2016   TRIG 88 02/24/2016   CHOLHDL 2.3 02/24/2016   Lab Results  Component Value Date   ALT 22 02/24/2016   AST 19 02/24/2016   ALKPHOS 57 02/24/2016   BILITOT 1.0 02/24/2016    Rosacea: Continued on Metrogel 1%  at last visit at last medicare wellness exam but recommended follow up with dermatology for skin cancer screening. --- He sees his dermatologist twice a year and is still using Metrogel.    Insomnia: Infrequent use of Ambien 5 mg as needed without side effects previously. -- Patient takes half a pill twice a week and uses melatonin as well.     Erectile dysfunction: Used Sildenafil 20 mg previously without new side effects. -- Patient takes 4 pills before sexual intercourse. He has tried taking 2-3 pills but states that this does not give him the same effects.  Denies chest pains, vision/color change, hearing changes, flushing or headaches.     Cancer screening:  Colon:  Dr Fuller Plan (gastroenterologist)  colonoscopy completed in 2015, plan for repeat 5 years.  Prostate: Patient does not have a urologist.   Lab Results  Component Value Date   PSA 0.5 02/24/2016   PSA 0.59 02/08/2015   PSA 0.50 01/19/2014    GERD with Barrett's: Endoscopy last month which did not show any concerns/changes.    Immunizations:  Immunization History  Administered Date(s) Administered  . Hepatitis A 02/24/1997, 02/18/2004  . Hepatitis A, Adult 07/28/2004  . Influenza,inj,Quad PF,6+ Mos 02/24/2016  . Influenza-Unspecified 03/28/2014  . MMR 02/18/2004  . Pneumococcal Conjugate-13 01/19/2014  . Pneumococcal Polysaccharide-23 08/10/2010  . Td 07/03/2002  . Tdap 01/19/2014  Shingles vaccine: Patient would like to  receive a shingles vaccination.  Flu: He would like a flu vaccination today as well.    Fall screening: Denies falls in the past year   Depression screening:  Depression screen [facility] 2/9 03/01/2017 02/24/2016 02/08/2015 01/19/2014  Decreased Interest 0 0 0 0  Down, Depressed, Hopeless 0 0 0 0  PHQ - 2 Score 0 0 0 0    Functional Status Survey: Is the patient deaf or have difficulty hearing?: No Does the patient have difficulty seeing, even when wearing glasses/contacts?: No Does the patient have difficulty concentrating, remembering, or making decisions?: No Does the patient have difficulty walking or climbing stairs?: No Does the patient have difficulty dressing or bathing?: No Does the patient have difficulty doing errands alone such as visiting a doctor's office or shopping?: No   Vision: Patient sees his eye doctor every 6  months as he has some pressure build up. He uses reading lens for his right eye and is able to use his left eye with ease.   Visual Acuity Screening   Right eye Left eye Both eyes  Without correction:     With correction: 20/100 20/20 20/20     Dentist: Patient is complaint with seeing the dentist yearly.    Exercise: Exercises daily (takes 1 day off). Golfs.   Advanced Directives: Patient has a living will and will bring a copy into the office.   Patient is willing to have a hepatitis C test  today.    Patient Active Problem List   Diagnosis Date Noted  . Rosacea 02/24/2016  . Hyperlipidemia 02/24/2016  . Erectile dysfunction 02/03/2013  . Lipids abnormal 09/25/2011  . Personal history of colonic polyps 09/26/2010  . Barrett's esophagus 09/26/2010   Past Medical History:  Diagnosis Date  . Adenomatous polyp   . Allergy   . Barrett's esophagus 08/2006  . Cataract   . GERD (gastroesophageal reflux disease)   . Glaucoma   . Hyperlipidemia   . Internal hemorrhoids 08/2005   Past Surgical History:  Procedure Laterality Date  . CATARACT EXTRACTION    . COLONOSCOPY    . EYE SURGERY    . HEMORRHOID SURGERY    . KNEE ARTHROSCOPY    . UPPER GASTROINTESTINAL ENDOSCOPY    . VASECTOMY     No Known Allergies Prior to Admission medications   Medication Sig Start Date End Date Taking? Authorizing Provider  aspirin 81 MG tablet Take 81 mg by mouth daily.      [provider]  atorvastatin (LIPITOR) 20 MG tablet TAKE ONE TABLET BY MOUTH DAILY 02/21/17   Stephen Agreste, MD  Glucosamine-Chondroit-Vit C-Mn (GLUCOSAMINE 1500 COMPLEX PO) Take by mouth.    [provider]  Ibuprofen (ADVIL) 200 MG CAPS Take 2 capsules by mouth 4 (four) times a week.    [provider]  latanoprost (XALATAN) 0.005 % ophthalmic solution  01/05/14   [provider]  metroNIDAZOLE (METROGEL) 1 % gel Apply topically daily. 02/24/16   Stephen Agreste, MD  omeprazole (PRILOSEC) 20 MG capsule Take 1 capsule (20 mg total) by mouth daily. 08/21/16   Ladene Artist, MD  sildenafil (REVATIO) 20 MG tablet Take 1-3 tablets (20-60 mg total) by mouth daily. Prior to onset of sexual activity as needed. 06/11/16   Stephen Agreste, MD  zolpidem (AMBIEN) 5 MG tablet Take 0.5-1 tablets (2.5-5 mg total) by mouth at bedtime as needed for sleep. 06/11/16   Stephen Agreste, MD   Social History   Social History  . Marital status: Single    Spouse name: N/A  . Number of children: 2   . Years of education: N/A   Occupational History  . Retired    Social History Main Topics  . Smoking status: Former Smoker    Types: Cigars  . Smokeless tobacco: Never Used     Comment: per pt-smoke a cigar every month  . Alcohol use 8.4 oz/week    14 Glasses of wine per week     Comment: couple glasses of wine nightly  . Drug use: No  . Sexual activity: Not on file   Other Topics Concern  . Not on file   Social History Narrative  . No narrative on file     Review of Systems  Constitutional: Negative for chills and fever.  Eyes: Negative for pain  and redness.  Respiratory: Negative for cough and shortness of breath.   Gastrointestinal: Negative for nausea and vomiting.  Neurological: Negative for syncope, speech difficulty and headaches.       Objective:   Physical Exam  Constitutional: He is oriented to person, place, and time. He appears well-developed and well-nourished.  HENT:  Head: Normocephalic and atraumatic.  Right Ear: External ear normal.  Left Ear: External ear normal.  Mouth/Throat: Oropharynx is clear and moist.  Eyes: Pupils are equal, round, and reactive to light. Conjunctivae and EOM are normal.  Neck: Normal range of motion. Neck supple. No thyromegaly present.  Cardiovascular: Normal rate, regular rhythm, normal heart sounds and intact distal pulses.   Pulmonary/Chest: Effort normal and breath sounds normal. No respiratory distress. He has no wheezes.  Abdominal: Soft. He exhibits no distension. There is no tenderness. Hernia confirmed negative in the right inguinal area and confirmed negative in the left inguinal area.  Genitourinary: Prostate normal.  Musculoskeletal: Normal range of motion. He exhibits no edema or tenderness.  Lymphadenopathy:    He has no cervical adenopathy.  Neurological: He is alert and oriented to person, place, and time. He has normal reflexes.  Skin: Skin is warm and dry.  Psychiatric: He has a normal mood and affect.  His behavior is normal.  Vitals reviewed.      Vitals:   03/01/17 0829  BP: (!) 140/91  Pulse: (!) 57  Resp: 16  Temp: (!) 97.4 F (36.3 C)  TempSrc: Oral  SpO2: 99%  Weight: 198 lb (89.8 kg)  Height: _0  (1.727 m)       Assessment & Plan:   LORANCE PICKERAL is a 71 y.o. male Medicare annual wellness visit, subsequent  -  - anticipatory guidance as below in AVS, screening labs if needed. Health maintenance items as above in HPI discussed/recommended as applicable.   - no concerning responses on depression, fall, or functional status screening. Any positive responses noted as above. Advanced directives discussed as in CHL.   Need for prophylactic vaccination and inoculation against influenza - Plan: Flu Vaccine QUAD 6+ mos PF IM (Fluarix Quad PF)  Need for shingles vaccine - Plan: Zoster Vac Recomb Adjuvanted ([place]) injection Sent to pharmacy  Gastroesophageal reflux disease with esophagitis Current use of proton pump inhibitor - Plan: Magnesium, Vitamin B12, Vitamin D, 25-hydroxy Encounter for vitamin deficiency screening - Plan: Magnesium, Vitamin B12, Vitamin D, 25-hydroxy  -Continue PPI as needed for symptoms, with long-term use will check for vitamin deficiencies.  Screening for prostate cancer - Plan: PSA  - We discussed pros and cons of prostate cancer screening, and after this discussion, he chose to have screening done. PSA obtained, and no concerning findings on DRE.   Hyperlipidemia, unspecified hyperlipidemia type - Plan: Lipid panel, Comprehensive metabolic panel, atorvastatin (LIPITOR) 20 MG tablet  - Tolerating Lipitor. Labs pending. No change in dose for now.  Encounter for hepatitis C screening test for low risk patient - Plan: Hepatitis C antibody  Rosacea - Plan: metroNIDAZOLE (METROGEL) 1 % gel  - Stable. Continue metronidazole gel and routine dermatology follow-up.   Erectile dysfunction, unspecified erectile dysfunction type - Plan:  sildenafil (REVATIO) 20 MG tablet  -sildenafil side effects discussed (including but not limited to headache/flushing, blue discoloration of vision, possible vascular steal and risk of cardiac effects if underlying unknown coronary artery disease, and permanent sensorineural hearing loss). Understanding expressed. Continue revatio (off label use discussed).   Insomnia, unspecified type - Plan:  zolpidem (AMBIEN) 5 MG tablet  - Tolerating Ambien with intermittent dosing, potential side effects discussed.   Meds ordered this encounter  Medications  . Zoster Vac Recomb Adjuvanted [facility]) injection    Sig: Inject 0.5 mLs into the muscle once. Repeat injection once in 2-6 months.    Dispense:  0.5 mL    Refill:  1  . atorvastatin (LIPITOR) 20 MG tablet    Sig: Take 1 tablet (20 mg total) by mouth daily.    Dispense:  90 tablet    Refill:  3  . metroNIDAZOLE (METROGEL) 1 % gel    Sig: Apply topically daily.    Dispense:  135 g    Refill:  3  . sildenafil (REVATIO) 20 MG tablet    Sig: Take 1-3 tablets (20-60 mg total) by mouth daily. Prior to onset of sexual activity as needed.    Dispense:  50 tablet    Refill:  1    Ins will not cover this. Pt must pay out of pocket.  Marland Kitchen zolpidem (AMBIEN) 5 MG tablet    Sig: Take 0.5-1 tablets (2.5-5 mg total) by mouth at bedtime as needed for sleep.    Dispense:  30 tablet    Refill:  1   Patient Instructions   No change in meds for now. I will send shingles vaccine to your pharmacy.  Continue routine follow up with dermatology and gastroenterology.  Please bring a copy of your living will to place in your chart.    Keeping you healthy  Get these tests  Blood pressure- Have your blood pressure checked once a year by your healthcare provider.  Normal blood pressure is 120/80  Weight- Have your body mass index (BMI) calculated to screen for obesity.  BMI is a measure of body fat based on height and weight. You can also calculate your own BMI at  ViewBanking.si.  Cholesterol- Have your cholesterol checked every year.  Diabetes- Have your blood sugar checked regularly if you have high blood pressure, high cholesterol, have a family history of diabetes or if you are overweight.  Screening for Colon Cancer- Colonoscopy starting at age 73.  Screening may begin sooner depending on your family history and other health conditions. Follow up colonoscopy as directed by your Gastroenterologist.  Screening for Prostate Cancer- Both blood work (PSA) and a rectal exam help screen for Prostate Cancer.  Screening begins at age 92 with African-American men and at age 40 with Caucasian men.  Screening may begin sooner depending on your family history.  Take these medicines  Aspirin- One aspirin daily can help prevent Heart disease and Stroke.  Flu shot- Every fall.  Tetanus- Every 10 years.  Zostavax- Once after the age of 88 to prevent Shingles.  Pneumonia shot- Once after the age of 44; if you are younger than 93, ask your healthcare provider if you need a Pneumonia shot.  Take these steps  Don't smoke- If you do smoke, talk to your doctor about quitting.  For tips on how to quit, go to www.smokefree.gov or call 1-800-QUIT-NOW.  Be physically active- Exercise 5 days a week for at least 30 minutes.  If you are not already physically active start slow and gradually work up to 30 minutes of moderate physical activity.  Examples of moderate activity include walking briskly, mowing the yard, dancing, swimming, bicycling, etc.  Eat a healthy diet- Eat a variety of healthy food such as fruits, vegetables, low fat milk, low fat cheese, yogurt,  lean meant, poultry, fish, beans, tofu, etc. For more information go to www.thenutritionsource.org  Drink alcohol in moderation- Limit alcohol intake to less than two drinks a day. Never drink and drive.  Dentist- Brush and floss twice daily; visit your dentist twice a year.  Depression- Your  emotional health is as important as your physical health. If you're feeling down, or losing interest in things you would normally enjoy please talk to your healthcare provider.  Eye exam- Visit your eye doctor every year.  Safe sex- If you may be exposed to a sexually transmitted infection, use a condom.  Seat belts- Seat belts can save your life; always wear one.  Smoke/Carbon Monoxide detectors- These detectors need to be installed on the appropriate level of your home.  Replace batteries at least once a year.  Skin cancer- When out in the sun, cover up and use sunscreen 15 SPF or higher.  Violence- If anyone is threatening you, please tell your healthcare provider.  Living Will/ Health care power of attorney- Speak with your healthcare provider and family.   IF you received an x-ray today, you will receive an invoice from [facility]. Please contact [facility] at [phone] with questions or concerns regarding your invoice.   IF you received labwork today, you will receive an invoice from [place]. Please contact [facility] at [phone] with questions or concerns regarding your invoice.   Our billing staff will not be able to assist you with questions regarding bills from these companies.  You will be contacted with the lab results as soon as they are available. The fastest way to get your results is to activate your My Chart account. Instructions are located on the last page of this paperwork. If you have not heard from [place] regarding the results in 2 weeks, please contact this office.       I personally performed the services described in this documentation, which was scribed in my presence. The recorded information has been reviewed and considered for accuracy and completeness, addended by me as needed, and agree with information above.  Signed,   Merri Ray, MD Primary Care at [place].  03/03/17 2:33 PM

## 2017-03-02 LAB — LIPID PANEL
CHOL/HDL RATIO: 2.4 ratio (ref 0.0–5.0)
Cholesterol, Total: 191 mg/dL (ref 100–199)
HDL: 79 mg/dL (ref >39)
LDL CALC: 101 mg/dL — AB (ref 0–99)
TRIGLYCERIDES: 56 mg/dL (ref 0–149)
VLDL CHOLESTEROL CAL: 11 mg/dL (ref 5–40)

## 2017-03-02 LAB — COMPREHENSIVE METABOLIC PANEL
A/G RATIO: 2 (ref 1.2–2.2)
ALBUMIN: 4.5 g/dL (ref 3.5–4.8)
ALK PHOS: 80 IU/L (ref 39–117)
ALT: 29 IU/L (ref 0–44)
AST: 25 IU/L (ref 0–40)
BILIRUBIN TOTAL: 0.8 mg/dL (ref 0.0–1.2)
BUN / CREAT RATIO: 18 (ref 10–24)
BUN: 14 mg/dL (ref 8–27)
CHLORIDE: 98 mmol/L (ref 96–106)
CO2: 25 mmol/L (ref 20–29)
Calcium: 9.7 mg/dL (ref 8.6–10.2)
Creatinine, Ser: 0.8 mg/dL (ref 0.76–1.27)
GFR calc Af Amer: 103 mL/min/{1.73_m2} (ref >59)
GFR calc non Af Amer: 89 mL/min/{1.73_m2} (ref >59)
Globulin, Total: 2.2 g/dL (ref 1.5–4.5)
Glucose: 108 mg/dL — ABNORMAL HIGH (ref 65–99)
POTASSIUM: 5.3 mmol/L — AB (ref 3.5–5.2)
SODIUM: 137 mmol/L (ref 134–144)
TOTAL PROTEIN: 6.7 g/dL (ref 6.0–8.5)

## 2017-03-02 LAB — MAGNESIUM: Magnesium: 2 mg/dL (ref 1.6–2.3)

## 2017-03-02 LAB — HEPATITIS C ANTIBODY: Hep C Virus Ab: <0.1 s/co ratio (ref 0.0–0.9)

## 2017-03-02 LAB — VITAMIN B12: VITAMIN B 12: 380 pg/mL (ref 232–1245)

## 2017-03-02 LAB — PSA: PROSTATE SPECIFIC AG, SERUM: 0.6 ng/mL (ref 0.0–4.0)

## 2017-03-02 LAB — VITAMIN D 25 HYDROXY (VIT D DEFICIENCY, FRACTURES): Vit D, 25-Hydroxy: 29.5 ng/mL — ABNORMAL LOW (ref 30.0–100.0)

## 2017-03-06 ENCOUNTER — Telehealth: Payer: Self-pay

## 2017-03-06 NOTE — Telephone Encounter (Signed)
Filed request for PA with [employer].  Should receive an approval or denial fax in 72 hrs.

## 2017-03-08 NOTE — Telephone Encounter (Signed)
Received a fax asking questions about medication.  I answered them and sent them to John to get Dr. Carlota Raspberry to sign and fax back.

## 2017-03-11 ENCOUNTER — Other Ambulatory Visit: Payer: Self-pay | Admitting: Family Medicine

## 2017-03-11 DIAGNOSIS — E875 Hyperkalemia: Secondary | ICD-10-CM

## 2017-03-11 DIAGNOSIS — R739 Hyperglycemia, unspecified: Secondary | ICD-10-CM

## 2017-03-11 NOTE — Progress Notes (Signed)
Orders for future lab only visit

## 2017-03-12 NOTE — Telephone Encounter (Signed)
Zolpidem Tartrate approval fax received.  Approved through 07/02/2017. Patient notified.

## 2017-05-26 ENCOUNTER — Other Ambulatory Visit: Payer: Self-pay | Admitting: Family Medicine

## 2017-05-26 DIAGNOSIS — E785 Hyperlipidemia, unspecified: Secondary | ICD-10-CM

## 2017-06-28 ENCOUNTER — Ambulatory Visit: Payer: PPO | Admitting: Family Medicine

## 2017-06-28 ENCOUNTER — Encounter: Payer: Self-pay | Admitting: Family Medicine

## 2017-06-28 ENCOUNTER — Other Ambulatory Visit: Payer: Self-pay

## 2017-06-28 VITALS — BP 128/72 | HR 85 | Temp 98.4°F | Ht 67.0 in | Wt 203.0 lb

## 2017-06-28 DIAGNOSIS — R05 Cough: Secondary | ICD-10-CM | POA: Diagnosis not present

## 2017-06-28 DIAGNOSIS — R059 Cough, unspecified: Secondary | ICD-10-CM

## 2017-06-28 LAB — POC INFLUENZA A&B (BINAX/QUICKVUE)
Influenza A, POC: NEGATIVE
Influenza B, POC: NEGATIVE

## 2017-06-28 MED ORDER — FLUTICASONE PROPIONATE 50 MCG/ACT NA SUSP: 1.0000 | Freq: Every day | NASAL | 1 refills | Status: DC

## 2017-06-28 NOTE — Progress Notes (Signed)
12/27/20183:32 PM  Stephen Conway 31-Dec-1945, 71 y.o. male [id number]  Chief Complaint  Patient presents with  . URI    1 wk    HPI:   Patient is a 71 y.o. male who presents today for 1 week of non productive cough, he feels had a cold towards the beginning of the week, reports continued PND, has been using OTC cold meds and a vicks nasal stick. Denies any fever, chills, SOB.   Depression screen [facility] 2/9 06/28/2017 03/01/2017 02/24/2016  Decreased Interest 0 0 0  Down, Depressed, Hopeless 0 0 0  PHQ - 2 Score 0 0 0    No Known Allergies  Prior to Admission medications   Medication Sig Start Date End Date Taking? Authorizing Provider  atorvastatin (LIPITOR) 20 MG tablet Take 1 tablet (20 mg total) by mouth daily. 03/01/17  Yes [name], MD  Ibuprofen (ADVIL) 200 MG CAPS Take 2 capsules by mouth 4 (four) times a week.   Yes [provider]  latanoprost (XALATAN) 0.005 % ophthalmic solution  01/05/14  Yes [provider]  metroNIDAZOLE (METROGEL) 1 % gel Apply topically daily. 03/01/17  Yes [name], MD  omeprazole (PRILOSEC) 20 MG capsule Take 1 capsule (20 mg total) by mouth daily. 08/21/16  Yes Ladene Artist, MD  zolpidem (AMBIEN) 5 MG tablet Take 0.5-1 tablets (2.5-5 mg total) by mouth at bedtime as needed for sleep. 03/01/17  Yes [name], MD  aspirin 81 MG tablet Take 81 mg by mouth daily.      [provider]  Glucosamine-Chondroit-Vit C-Mn (GLUCOSAMINE 1500 COMPLEX PO) Take by mouth.    [provider]  sildenafil (REVATIO) 20 MG tablet Take 1-3 tablets (20-60 mg total) by mouth daily. Prior to onset of sexual activity as needed. Patient not taking: Reported on 06/28/2017 03/01/17   [name], MD    Past Medical History:  Diagnosis Date  . Adenomatous polyp   . Allergy   . Barrett's esophagus 08/2006  . Cataract   . GERD (gastroesophageal reflux disease)   . Glaucoma   . Hyperlipidemia   . Internal  hemorrhoids 08/2005    Past Surgical History:  Procedure Laterality Date  . CATARACT EXTRACTION    . COLONOSCOPY    . EYE SURGERY    . HEMORRHOID SURGERY    . KNEE ARTHROSCOPY    . UPPER GASTROINTESTINAL ENDOSCOPY    . VASECTOMY      Social History   Tobacco Use  . Smoking status: Former Smoker    Types: Cigars  . Smokeless tobacco: Never Used  . Tobacco comment: per pt-smoke a cigar every month  Substance Use Topics  . Alcohol use: Yes    Alcohol/week: 8.4 oz    Types: 14 Glasses of wine per week    Comment: couple glasses of wine nightly    Family History  Problem Relation Age of Onset  . Diabetes Father   . Stroke Father   . Stomach cancer Brother   . Colon cancer Neg Hx     ROS Per hpi  OBJECTIVE:  Blood pressure 128/72, pulse 85, temperature 98.4 F (36.9 C), temperature source Oral, height 5\' 7"  (1.702 m), weight 203 lb (92.1 kg), SpO2 97 %.  Physical Exam  Constitutional: He is oriented to person, place, and time and well-developed, well-nourished, and in no distress.  HENT:  Head: Normocephalic and atraumatic.  Right Ear: Hearing, tympanic membrane, external ear and ear  canal normal.  Left Ear: Hearing, tympanic membrane, external ear and ear canal normal.  Mouth/Throat: Oropharynx is clear and moist. No oropharyngeal exudate.  Eyes: Conjunctivae and EOM are normal. Pupils are equal, round, and reactive to light.  Neck: Neck supple.  Cardiovascular: Normal rate and regular rhythm. Exam reveals no gallop and no friction rub.  No murmur heard. Pulmonary/Chest: Effort normal and breath sounds normal. He has no wheezes. He has no rales.  Lymphadenopathy:    He has no cervical adenopathy.  Neurological: He is alert and oriented to person, place, and time. Gait normal.  Skin: Skin is warm and dry.    Results for orders placed or performed in visit on 06/28/17 (from the past 24 hour(s))  POC Influenza A&B(BINAX/QUICKVUE)     Status: None   Collection  Time: 06/28/17  3:33 PM  Result Value Ref Range   Influenza A, POC Negative Negative   Influenza B, POC Negative Negative    ASSESSMENT and PLAN 1. Cough Most likely from continued PND. Discussed dc nasal vicks given concerns for mucosal irritation. Discussed supportive measuresI: increase hydration, humidifier, OTC medications, etc. RTC precautions discussed. - POC Influenza A&B(BINAX/QUICKVUE) - fluticasone (FLONASE) 50 MCG/ACT nasal spray; Place 1 spray into both nostrils daily.  Return if symptoms worsen or fail to improve.    Rutherford Guys, MD Primary Care at [place], [place] [address] Ph.  [phone] Fax [phone]

## 2017-06-28 NOTE — Progress Notes (Signed)
aw

## 2017-06-28 NOTE — Patient Instructions (Addendum)
1. Stop nasal vicks applicator. Continue with dayquil/nyquil as needed, cont with nasal saline and humidifier.    IF you received an x-ray today, you will receive an invoice from [facility]. Please contact [facility] at [phone] with questions or concerns regarding your invoice.   IF you received labwork today, you will receive an invoice from [place]. Please contact [facility] at [phone] with questions or concerns regarding your invoice.   Our billing staff will not be able to assist you with questions regarding bills from these companies.  You will be contacted with the lab results as soon as they are available. The fastest way to get your results is to activate your My Chart account. Instructions are located on the last page of this paperwork. If you have not heard from [place] regarding the results in 2 weeks, please contact this office.

## 2017-07-02 ENCOUNTER — Ambulatory Visit: Payer: PPO | Admitting: Emergency Medicine

## 2017-07-09 DIAGNOSIS — R69 Illness, unspecified: Secondary | ICD-10-CM | POA: Diagnosis not present

## 2017-07-19 ENCOUNTER — Ambulatory Visit (INDEPENDENT_AMBULATORY_CARE_PROVIDER_SITE_OTHER): Payer: Medicare HMO

## 2017-07-19 ENCOUNTER — Ambulatory Visit: Payer: Medicare HMO | Admitting: Podiatry

## 2017-07-19 ENCOUNTER — Encounter: Payer: Self-pay | Admitting: Podiatry

## 2017-07-19 ENCOUNTER — Ambulatory Visit: Payer: Medicare HMO

## 2017-07-19 DIAGNOSIS — M2040 Other hammer toe(s) (acquired), unspecified foot: Secondary | ICD-10-CM

## 2017-07-19 DIAGNOSIS — M2020 Hallux rigidus, unspecified foot: Secondary | ICD-10-CM | POA: Diagnosis not present

## 2017-07-19 DIAGNOSIS — L84 Corns and callosities: Secondary | ICD-10-CM

## 2017-07-19 DIAGNOSIS — Q828 Other specified congenital malformations of skin: Secondary | ICD-10-CM

## 2017-07-19 NOTE — Patient Instructions (Signed)
Hammer Toe Hammer toe is a change in the shape (a deformity) of your second, third, or fourth toe. The deformity causes the middle joint of your toe to stay bent. This causes pain, especially when you are wearing shoes. Hammer toe starts gradually. At first, the toe can be straightened. Gradually over time, the deformity becomes stiff and permanent. Early treatments to keep the toe straight may relieve pain. As the deformity becomes stiff and permanent, surgery may be needed to straighten the toe. What are the causes? Hammer toe is caused by abnormal bending of the toe joint that is closest to your foot. It happens gradually over time. This pulls on the muscles and connections (tendons) of the toe joint, making them weak and stiff. It is often related to wearing shoes that are too short or narrow and do not let your toes straighten. What increases the risk? You may be at greater risk for hammer toe if you:  Are male.  Are older.  Wear shoes that are too small.  Wear high-heeled shoes that pinch your toes.  Are a ballet dancer.  Have a second toe that is longer than your big toe (first toe).  Injure your foot or toe.  Have arthritis.  Have a family history of hammer toe.  Have a nerve or muscle disorder.  What are the signs or symptoms? The main symptoms of this condition are pain and deformity of the toe. The pain is worse when wearing shoes, walking, or running. Other symptoms may include:  Corns or calluses over the bent part of the toe or between the toes.  Redness and a burning feeling on the toe.  An open sore that forms on the top of the toe.  Not being able to straighten the toe.  How is this diagnosed? This condition is diagnosed based on your symptoms and a physical exam. During the exam, your health care provider will try to straighten your toe to see how stiff the deformity is. You may also have tests, such as:  A blood test to check for rheumatoid  arthritis.  An X-ray to show how severe the deformity is.  How is this treated? Treatment for this condition will depend on how stiff the deformity is. Surgery is often needed. However, sometimes a hammer toe can be straightened without surgery. Treatments that do not involve surgery include:  Taping the toe into a straightened position.  Using pads and cushions to protect the toe (orthotics).  Wearing shoes that provide enough room for the toes.  Doing toe-stretching exercises at home.  Taking an NSAID to reduce pain and swelling.  If these treatments do not help or the toe cannot be straightened, surgery is the next option. The most common surgeries used to straighten a hammer toe include:  Arthroplasty. In this procedure, part of the joint is removed, and that allows the toe to straighten.  Fusion. In this procedure, cartilage between the two bones of the joint is taken out and the bones are fused together into one longer bone.  Implantation. In this procedure, part of the bone is removed and replaced with an implant to let the toe move again.  Flexor tendon transfer. In this procedure, the tendons that curl the toes down (flexor tendons) are repositioned.  Follow these instructions at home:  Take over-the-counter and prescription medicines only as told by your health care provider.  Do toe straightening and stretching exercises as told by your health care provider.  Keep all   follow-up visits as told by your health care provider. This is important. How is this prevented?  Wear shoes that give your toes enough room and do not cause pain.  Do not wear high-heeled shoes. Contact a health care provider if:  Your pain gets worse.  Your toe becomes red or swollen.  You develop an open sore on your toe. This information is not intended to replace advice given to you by your health care provider. Make sure you discuss any questions you have with your health care  provider. Document Released: 06/16/2000 Document Revised: 01/07/2016 Document Reviewed: 10/13/2015 Elsevier Interactive Patient Education  2018 [employer]  

## 2017-07-20 NOTE — Progress Notes (Signed)
Subjective:   Patient ID: Stephen Conway, male   DOB: 72 y.o.   MRN: [id number]   HPI Patient presents concerned about digital deformity second right and a lot of pain between the big toe and second toe of the left foot.  States that the toe is not really bothering him on the right but Stephen Conway is concerned about the continued elevation   ROS      Objective:  Physical Exam  Neurovascular status intact with patient is non-smoker and likes to be active.  Stephen Conway is found to have rigid contracture digit to right at the MPJ with mild redness of the toe but no active pain and has a significant keratotic lesion on the lateral side of the left hallux with keratotic tissue formation and pressure between the hallux and second toe     Assessment:  Hammertoe deformity second right and abnormal positioning between the hallux and second digit left with keratotic tissue formation     Plan:  H&P x-rays reviewed with patient.  At this point we will focus on the left foot and I did anesthetized with 60 mg like Marcaine mixture and using sharp sterile his mentation I debrided all the lesion and tissue and applied padding and we will see whether or not this gives him complete results.  May eventually need digital procedure for this  X-rays indicate there is rigid contracture digit to right and there is moderate lateral deviation of the hallux left

## 2017-07-25 DIAGNOSIS — L578 Other skin changes due to chronic exposure to nonionizing radiation: Secondary | ICD-10-CM | POA: Diagnosis not present

## 2017-07-25 DIAGNOSIS — L814 Other melanin hyperpigmentation: Secondary | ICD-10-CM | POA: Diagnosis not present

## 2017-07-25 DIAGNOSIS — D225 Melanocytic nevi of trunk: Secondary | ICD-10-CM | POA: Diagnosis not present

## 2017-07-25 DIAGNOSIS — D1801 Hemangioma of skin and subcutaneous tissue: Secondary | ICD-10-CM | POA: Diagnosis not present

## 2017-07-25 DIAGNOSIS — L821 Other seborrheic keratosis: Secondary | ICD-10-CM | POA: Diagnosis not present

## 2017-07-30 DIAGNOSIS — H35033 Hypertensive retinopathy, bilateral: Secondary | ICD-10-CM | POA: Diagnosis not present

## 2017-07-30 DIAGNOSIS — H401122 Primary open-angle glaucoma, left eye, moderate stage: Secondary | ICD-10-CM | POA: Diagnosis not present

## 2017-07-30 DIAGNOSIS — Z961 Presence of intraocular lens: Secondary | ICD-10-CM | POA: Diagnosis not present

## 2017-07-30 DIAGNOSIS — H401111 Primary open-angle glaucoma, right eye, mild stage: Secondary | ICD-10-CM | POA: Diagnosis not present

## 2017-08-02 DIAGNOSIS — Z0101 Encounter for examination of eyes and vision with abnormal findings: Secondary | ICD-10-CM | POA: Diagnosis not present

## 2017-08-29 DIAGNOSIS — L570 Actinic keratosis: Secondary | ICD-10-CM | POA: Diagnosis not present

## 2017-09-04 ENCOUNTER — Other Ambulatory Visit: Payer: Self-pay | Admitting: Gastroenterology

## 2017-09-12 ENCOUNTER — Other Ambulatory Visit: Payer: Self-pay | Admitting: Gastroenterology

## 2017-09-26 DIAGNOSIS — L570 Actinic keratosis: Secondary | ICD-10-CM | POA: Diagnosis not present

## 2017-09-26 DIAGNOSIS — L819 Disorder of pigmentation, unspecified: Secondary | ICD-10-CM | POA: Diagnosis not present

## 2017-11-07 ENCOUNTER — Other Ambulatory Visit: Payer: Self-pay | Admitting: Family Medicine

## 2017-11-07 DIAGNOSIS — N529 Male erectile dysfunction, unspecified: Secondary | ICD-10-CM

## 2017-11-07 NOTE — Telephone Encounter (Signed)
Sildenafil 20 mg refill request  LOV 03/01/17 with Dr. Carlota Raspberry

## 2017-11-08 NOTE — Telephone Encounter (Signed)
Refill

## 2017-11-09 NOTE — Telephone Encounter (Signed)
Discussed at last physical - refilled.

## 2017-11-28 ENCOUNTER — Other Ambulatory Visit: Payer: Self-pay | Admitting: Gastroenterology

## 2017-11-29 ENCOUNTER — Telehealth: Payer: Self-pay | Admitting: Gastroenterology

## 2017-11-29 ENCOUNTER — Other Ambulatory Visit: Payer: Self-pay | Admitting: Family Medicine

## 2017-11-29 ENCOUNTER — Other Ambulatory Visit: Payer: Self-pay | Admitting: Gastroenterology

## 2017-11-29 DIAGNOSIS — G4700 Insomnia, unspecified: Secondary | ICD-10-CM

## 2017-11-29 MED ORDER — OMEPRAZOLE 20 MG PO CPDR: 20.0000 mg | DELAYED_RELEASE_CAPSULE | Freq: Every day | ORAL | 0 refills | Status: DC

## 2017-11-29 NOTE — Telephone Encounter (Signed)
Informed patient Dr. Fuller Plan does like to see his patient's every year for prescription refills unless he wants to get his PCP to refill this medication if he sees them more frequently. Patient states he was told to come in 01/2019 again. Informed patient that's when he is due of his recall Colonoscopy but does need to come in every year also. Patient verbalized understanding and scheduled appt for 02/19/18 at 8:30am. Prescription sent to patient's pharmacy.

## 2017-11-29 NOTE — Telephone Encounter (Signed)
Left a message for patient to return my call. 

## 2018-01-27 ENCOUNTER — Other Ambulatory Visit: Payer: Self-pay | Admitting: Family Medicine

## 2018-01-27 DIAGNOSIS — N529 Male erectile dysfunction, unspecified: Secondary | ICD-10-CM

## 2018-01-28 NOTE — Telephone Encounter (Signed)
Req. For refill on Sildenafil; LRF 11/09/17; #50; no RF  Last OV: acute on 06/28/17                 Annual on 03/01/17  PCP: Dr  Leanne Lovely. [facility] on [address] [place].

## 2018-02-05 DIAGNOSIS — H401111 Primary open-angle glaucoma, right eye, mild stage: Secondary | ICD-10-CM | POA: Diagnosis not present

## 2018-02-05 DIAGNOSIS — H401122 Primary open-angle glaucoma, left eye, moderate stage: Secondary | ICD-10-CM | POA: Diagnosis not present

## 2018-02-19 ENCOUNTER — Encounter: Payer: Self-pay | Admitting: Gastroenterology

## 2018-02-19 ENCOUNTER — Ambulatory Visit (INDEPENDENT_AMBULATORY_CARE_PROVIDER_SITE_OTHER): Payer: Medicare HMO | Admitting: Gastroenterology

## 2018-02-19 VITALS — BP 128/76 | HR 80 | Ht 70.0 in | Wt 205.1 lb

## 2018-02-19 DIAGNOSIS — K2270 Barrett's esophagus without dysplasia: Secondary | ICD-10-CM | POA: Diagnosis not present

## 2018-02-19 DIAGNOSIS — Z86010 Personal history of colonic polyps: Secondary | ICD-10-CM | POA: Diagnosis not present

## 2018-02-19 MED ORDER — OMEPRAZOLE 20 MG PO CPDR: 20.0000 mg | DELAYED_RELEASE_CAPSULE | Freq: Every day | ORAL | 3 refills | Status: DC

## 2018-02-19 NOTE — Progress Notes (Signed)
    History of Present Illness: This is a 72 year old male with a history of short segment Barrett's esophagus without dysplasia.  His reflux symptoms are under excellent control on daily omeprazole.  He takes Metamucil daily.  He has no gastrointestinal complaints.  Denies weight loss, abdominal pain, constipation, diarrhea, change in stool caliber, melena, hematochezia, nausea, vomiting, dysphagia, reflux symptoms, chest pain.   Current Medications, Allergies, Past Medical History, Past Surgical History, Family History and Social History were reviewed in [employer] record.  Physical Exam: General: Well developed, well nourished, no acute distress Head: Normocephalic and atraumatic Eyes:  sclerae anicteric, EOMI Ears: Normal auditory acuity Mouth: No deformity or lesions Lungs: Clear throughout to auscultation Heart: Regular rate and rhythm; no murmurs, rubs or bruits Abdomen: Soft, non tender and non distended. No masses, hepatosplenomegaly or hernias noted. Normal Bowel sounds Rectal: Not done Musculoskeletal: Symmetrical with no gross deformities  Pulses:  Normal pulses noted Extremities: No clubbing, cyanosis, edema or deformities noted Neurological: Alert oriented x 4, grossly nonfocal Psychological:  Alert and cooperative. Normal mood and affect  Assessment and Recommendations:  1. Short segment Barrett's esophagus without dysplasia. Surveillance EGD due in July 2021.  Continue omeprazole 20 mg daily.  Follow standard antireflux measures.  2.  Personal history of adenomatous colon polyps.  Surveillance colonoscopy due in July 2020.  Patient requests endoscopy and colonoscopy to be scheduled at the same time so we will target to do both procedures in December 2020.

## 2018-02-19 NOTE — Patient Instructions (Signed)
We have sent the following medications to your pharmacy for you to pick up at your convenience: omeprazole.   Normal BMI (Body Mass Index- based on height and weight) is between 23 and 30. Your BMI today is Body mass index is 29.43 kg/m. Marland Kitchen Please consider follow up  regarding your BMI with your Primary Care Provider.  Thank you for choosing me and [facility].  Pricilla Riffle. Dagoberto Ligas., MD., Marval Regal

## 2018-03-07 ENCOUNTER — Encounter: Payer: Self-pay | Admitting: Family Medicine

## 2018-03-07 ENCOUNTER — Other Ambulatory Visit: Payer: Self-pay

## 2018-03-07 ENCOUNTER — Ambulatory Visit (INDEPENDENT_AMBULATORY_CARE_PROVIDER_SITE_OTHER): Payer: Medicare HMO | Admitting: Family Medicine

## 2018-03-07 VITALS — BP 124/80 | HR 72 | Temp 97.6°F | Ht 69.0 in | Wt 202.0 lb

## 2018-03-07 DIAGNOSIS — Z23 Encounter for immunization: Secondary | ICD-10-CM | POA: Diagnosis not present

## 2018-03-07 DIAGNOSIS — R739 Hyperglycemia, unspecified: Secondary | ICD-10-CM

## 2018-03-07 DIAGNOSIS — E7800 Pure hypercholesterolemia, unspecified: Secondary | ICD-10-CM

## 2018-03-07 DIAGNOSIS — R21 Rash and other nonspecific skin eruption: Secondary | ICD-10-CM

## 2018-03-07 DIAGNOSIS — Z131 Encounter for screening for diabetes mellitus: Secondary | ICD-10-CM | POA: Diagnosis not present

## 2018-03-07 DIAGNOSIS — N529 Male erectile dysfunction, unspecified: Secondary | ICD-10-CM | POA: Diagnosis not present

## 2018-03-07 DIAGNOSIS — Z0000 Encounter for general adult medical examination without abnormal findings: Secondary | ICD-10-CM | POA: Diagnosis not present

## 2018-03-07 DIAGNOSIS — Z125 Encounter for screening for malignant neoplasm of prostate: Secondary | ICD-10-CM

## 2018-03-07 DIAGNOSIS — G4700 Insomnia, unspecified: Secondary | ICD-10-CM

## 2018-03-07 MED ORDER — SILDENAFIL CITRATE 20 MG PO TABS: ORAL_TABLET | ORAL | 0 refills | Status: DC

## 2018-03-07 MED ORDER — ZOLPIDEM TARTRATE 5 MG PO TABS: 2.5000 mg | ORAL_TABLET | Freq: Every evening | ORAL | 0 refills | Status: DC | PRN

## 2018-03-07 MED ORDER — ZOSTER VAC RECOMB ADJUVANTED 50 MCG/0.5ML IM SUSR: 0.5000 mL | Freq: Once | INTRAMUSCULAR | 1 refills | Status: AC

## 2018-03-07 NOTE — Progress Notes (Signed)
Subjective:  By signing my name below, I, Stephen Conway, attest that this documentation has been prepared under the direction and in the presence of [name], MD Electronically Signed: Ladene Artist, ED Scribe 03/07/2018 at 8:26 AM.   Patient ID: Stephen Conway, male    DOB: [birth year], 72 y.o.   MRN: [id number]  Chief Complaint  Patient presents with  . Annual Exam    CPE   HPI Stephen Conway is a 72 y.o. male who presents to Primary Care at [facility] for an annual exam. H/o GERD with Barrett's esophagus, ED, hyperlipidemia, insomnia. Pt is fasting at this OV.  GERD with Barrett's Followed by Dr. Fuller Plan. Last seen 02/19/18. On omeprazole. Normal B12, magnesium, borderline Vit D last yr. - Pt has stopped taking daily 81 mg asa. Takes Tylenol occasionally while playing tennis. He is not currently taking Vit D supplement but plans to start.  Hyperglycemia Glucose of 108 last CPE.  Hyperlipidemia Lab Results  Component Value Date   CHOL 191 03/01/2017   HDL 79 03/01/2017   LDLCALC 101 (H) 03/01/2017   TRIG 56 03/01/2017   CHOLHDL 2.4 03/01/2017   Lab Results  Component Value Date   ALT 29 03/01/2017   AST 25 03/01/2017   ALKPHOS 80 03/01/2017   BILITOT 0.8 03/01/2017  borderline readings prior. Not on meds.    ED Used sildenafil in the past 20-60 mg prn. - Currently takes 60 mg. Denies HA, cp, sob, flushing, changes in vision or hearing.  Insomnia Switched to OTC melatonin/[employer] sleep aid. Occasionally requires [place] for international travel. Denies parasomnia.  CA Screening Colonoscopy: 12/2013, planned for rpt endoscopy and coloscopy in 2020. Family h/o colon polyps Prostate CA Screening: PSA normal at 0.6 01/2017. Pt agrees to PSA and DRE at this time. Lab Results  Component Value Date   PSA 0.5 02/24/2016   PSA 0.59 02/08/2015   PSA 0.50 01/19/2014   Immunizations Immunization History  Administered Date(s) Administered  . Hepatitis A 02/24/1997,  02/18/2004  . Hepatitis A, Adult 07/28/2004  . Influenza,inj,Quad PF,6+ Mos 02/24/2016, 03/01/2017  . Influenza-Unspecified 03/28/2014  . MMR 02/18/2004  . Pneumococcal Conjugate-13 01/19/2014  . Pneumococcal Polysaccharide-23 08/10/2010  . Td 07/03/2002  . Tdap 01/19/2014  Shingrix: sent to pharmacy Flu: today  Fall No falls within the past yr.  Depression Screening Depression screen [facility] 2/9 03/07/2018 06/28/2017 03/01/2017 02/24/2016 02/08/2015  Decreased Interest 0 0 0 0 0  Down, Depressed, Hopeless 0 0 0 0 0  PHQ - 2 Score 0 0 0 0 0   Functional Status Survey: Is the patient deaf or have difficulty hearing?: No Does the patient have difficulty seeing, even when wearing glasses/contacts?: No Does the patient have difficulty concentrating, remembering, or making decisions?: No Does the patient have difficulty walking or climbing stairs?: No Does the patient have difficulty dressing or bathing?: No Does the patient have difficulty doing errands alone such as visiting a doctor's office or shopping?: No  Mental Status Screening 6CIT Screen 03/07/2018  What Year? 0 points  What month? 0 points  What time? 0 points  Count back from 20 0 points  Months in reverse 0 points  Repeat phrase 0 points  Total Score 0     Visual Acuity Screening   Right eye Left eye Both eyes  Without correction:     With correction: 20/70 20/20-1 20/20-2   Vision: twice/yr Dentist: every 9 months Dermatology: every 6 months for rosacea. Reports recurrent  rash to axilla that he has been treating with baby powder. Suspects it may be due to [facility] deodorant.  Has been discussed with derm, minimal symptoms at present.  Exercise: plays tennis  Advanced Directives  Has a HCPOA and living will.  Patient Active Problem List   Diagnosis Date Noted  . Rosacea 02/24/2016  . Hyperlipidemia 02/24/2016  . Erectile dysfunction 02/03/2013  . Lipids abnormal 09/25/2011  . Personal history of colonic polyps  09/26/2010  . Barrett's esophagus 09/26/2010   Past Medical History:  Diagnosis Date  . Adenomatous polyp   . Allergy   . Barrett's esophagus 08/2006  . Cataract   . GERD (gastroesophageal reflux disease)   . Glaucoma   . Hyperlipidemia   . Internal hemorrhoids 08/2005   Past Surgical History:  Procedure Laterality Date  . CATARACT EXTRACTION    . COLONOSCOPY    . EYE SURGERY    . HEMORRHOID SURGERY    . KNEE ARTHROSCOPY    . UPPER GASTROINTESTINAL ENDOSCOPY    . VASECTOMY     No Known Allergies Prior to Admission medications   Medication Sig Start Date End Date Taking? Authorizing Provider  fluticasone (FLONASE) 50 MCG/ACT nasal spray Place 1 spray into both nostrils daily. Patient taking differently: Place 1 spray into both nostrils daily as needed.  06/28/17  Yes Rutherford Guys, MD  Glucosamine-Chondroit-Vit C-Mn (GLUCOSAMINE 1500 COMPLEX PO) Take by mouth.   Yes [provider]  Homeopathic Products (PROSACEA) GEL Apply topically as needed.   Yes [provider]  Ibuprofen (ADVIL) 200 MG CAPS Take 2 capsules by mouth 4 (four) times a week.   Yes [provider]  latanoprost (XALATAN) 0.005 % ophthalmic solution  01/05/14  Yes [provider]  omeprazole (PRILOSEC) 20 MG capsule Take 1 capsule (20 mg total) by mouth daily. 02/19/18  Yes Ladene Artist, MD  sildenafil (REVATIO) 20 MG tablet TAKE 1 TO 3 TABLETS BY MOUTH DAILY PRIOR TO ONSET OF SEXUAL ACTIVITY AS NEEDED 11/09/17  Yes [name], MD  zolpidem (AMBIEN) 5 MG tablet Take 0.5-1 tablets (2.5-5 mg total) by mouth at bedtime as needed for sleep. 03/01/17  Yes [name], MD  zaleplon (SONATA) 10 MG capsule 10 mg. One tablet by mouth once a week   09/25/11  [provider]   Social History   Socioeconomic History  . Marital status: Single    Spouse name: Not on file  . Number of children: 2  . Years of education: Not on file  . Highest education level: Not on  file  Occupational History  . Occupation: Retired  Scientific laboratory technician  . Financial resource strain: Not on file  . Food insecurity:    Worry: Not on file    Inability: Not on file  . Transportation needs:    Medical: Not on file    Non-medical: Not on file  Tobacco Use  . Smoking status: Former Smoker    Types: Cigars  . Smokeless tobacco: Never Used  . Tobacco comment: per pt-smoke a cigar every month  Substance and Sexual Activity  . Alcohol use: Yes    Alcohol/week: 14.0 standard drinks    Types: 14 Glasses of wine per week    Comment: couple glasses of wine nightly  . Drug use: No  . Sexual activity: Not on file  Lifestyle  . Physical activity:    Days per week: Not on file    Minutes per session: Not  on file  . Stress: Not on file  Relationships  . Social connections:    Talks on phone: Not on file    Gets together: Not on file    Attends religious service: Not on file    Active member of club or organization: Not on file    Attends meetings of clubs or organizations: Not on file    Relationship status: Not on file  . Intimate partner violence:    Fear of current or ex partner: Not on file    Emotionally abused: Not on file    Physically abused: Not on file    Forced sexual activity: Not on file  Other Topics Concern  . Not on file  Social History Narrative  . Not on file   Review of Systems  Constitutional: Negative for diaphoresis.  HENT: Negative for hearing loss.   Eyes: Negative for visual disturbance.  Respiratory: Negative for shortness of breath.   Cardiovascular: Negative for chest pain.  Skin: Positive for rash.  Neurological: Negative for headaches.  Psychiatric/Behavioral: Positive for sleep disturbance.      Objective:   Physical Exam  Constitutional: He is oriented to person, place, and time. He appears well-developed and well-nourished.  HENT:  Head: Normocephalic and atraumatic.  Right Ear: External ear normal.  Left Ear: External ear  normal.  Mouth/Throat: Oropharynx is clear and moist.  Eyes: Pupils are equal, round, and reactive to light. Conjunctivae and EOM are normal.  Neck: Normal range of motion. Neck supple. No thyromegaly present.  Cardiovascular: Normal rate, regular rhythm, normal heart sounds and intact distal pulses.  Pulmonary/Chest: Effort normal and breath sounds normal. No respiratory distress. He has no wheezes.  Abdominal: Soft. He exhibits no distension. There is no tenderness. Hernia confirmed negative in the right inguinal area and confirmed negative in the left inguinal area.  Genitourinary: Prostate normal.  Musculoskeletal: Normal range of motion. He exhibits no edema or tenderness.  Lymphadenopathy:    He has no cervical adenopathy.  Neurological: He is alert and oriented to person, place, and time. He has normal reflexes.  Skin: Skin is warm and dry.  Poss faint hyperpigmentation of axillary skin bilat with few small satellite patches.  Psychiatric: He has a normal mood and affect. His behavior is normal.  Vitals reviewed.  Vitals:   03/07/18 0757 03/07/18 0809  BP: (!) 147/84 124/80  Pulse: 72   Temp: 97.6 F (36.4 C)   TempSrc: Oral   SpO2: 98%   Weight: 202 lb (91.6 kg)   Height: '5\' 9"'$  (1.753 m)       Assessment & Plan:    BRADRICK KAMAU is a 72 y.o. male Medicare annual wellness visit, subsequent  -  - anticipatory guidance as below in AVS, screening labs if needed. Health maintenance items as above in HPI discussed/recommended as applicable.   - no concerning responses on depression, fall, or functional status screening. Any positive responses noted as above. Advanced directives discussed as in CHL.   -Risk/benefits of aspirin were discussed, has chosen to stop aspirin at this time from primary prevention standpoint.  Need for influenza vaccination - Plan: Flu vaccine HIGH DOSE PF (Fluzone High dose)  Need for shingles vaccine - Plan: Zoster Vaccine Adjuvanted [facility])  injection sent to pharmacy.   Erectile dysfunction, unspecified erectile dysfunction type - Plan: sildenafil (REVATIO) 20 MG tablet  - sildenafil Rx given - use lowest effective dose. Potential side effects discussed previously.   Currently without any  side effects, prescription refilled.  Screening for diabetes mellitus - Plan: Comprehensive metabolic panel, Hemoglobin A1c Hyperglycemia - Plan: Comprehensive metabolic panel, Hemoglobin A1c  -Mild elevation previously, check A1c to screen for diabetes.  Continue exercise, watch diet  Pure hypercholesterolemia - Plan: Lipid panel  -Borderline reading previously, repeat testing.  Screening for prostate cancer - Plan: PSA  - We discussed pros and cons of prostate cancer screening, and after this discussion, he chose to have screening done. PSA obtained, and no concerning findings on DRE.   Insomnia, unspecified type - Plan: zolpidem (AMBIEN) 5 MG tablet  -Stable with over-the-counter melatonin, rare use of Ambien with travel only.  Refilled for lowest effective dose and potential side effects have been discussed.  Rash  -Possible contact dermatitis, less likely inverse psoriasis or intertrigo.    -Mild deodorant discussed, RTC precautions if more notable rash and at that time can decide on other treatment options.  Meds ordered this encounter  Medications  . Zoster Vaccine Adjuvanted [facility]) injection    Sig: Inject 0.5 mLs into the muscle once for 1 dose. Repeat in 2-6 months.    Dispense:  0.5 mL    Refill:  1  . sildenafil (REVATIO) 20 MG tablet    Sig: TAKE 1 TO 3 TABLETS BY MOUTH DAILY PRIOR TO ONSET OF SEXUAL ACTIVITY AS NEEDED    Dispense:  50 tablet    Refill:  0  . zolpidem (AMBIEN) 5 MG tablet    Sig: Take 0.5-1 tablets (2.5-5 mg total) by mouth at bedtime as needed for sleep.    Dispense:  30 tablet    Refill:  0   Patient Instructions    Over the counter vitamin D 2000 units per day as your level was borderline low  last year, and that can sometimes occur with use of proton pump inhibitor daily.  Blood sugar was slightly elevated last year, I will screen for diabetes with blood work today.  Shingles vaccine sent to your pharmacy.   Milder deodorant may be helpful for armpit rash, but as we discussed that can be due to a number of causes.  If that rash worsens, I am happy to see you at that time so we can discuss that further.  Ambien if needed, but would recommend initially taking melatonin as that may be safer.  Thank you for coming in today.  Preventive Care 78 Years and Older, Male Preventive care refers to lifestyle choices and visits with your health care provider that can promote health and wellness. What does preventive care include?  A yearly physical exam. This is also called an annual well check.  Dental exams once or twice a year.  Routine eye exams. Ask your health care provider how often you should have your eyes checked.  Personal lifestyle choices, including: ? Daily care of your teeth and gums. ? Regular physical activity. ? Eating a healthy diet. ? Avoiding tobacco and drug use. ? Limiting alcohol use. ? Practicing safe sex. ? Taking low doses of aspirin every day. ? Taking vitamin and mineral supplements as recommended by your health care provider. What happens during an annual well check? The services and screenings done by your health care provider during your annual well check will depend on your age, overall health, lifestyle risk factors, and family history of disease. Counseling Your health care provider may ask you questions about your:  Alcohol use.  Tobacco use.  Drug use.  Emotional well-being.  Home and relationship  well-being.  Sexual activity.  Eating habits.  History of falls.  Memory and ability to understand (cognition).  Work and work Statistician.  Screening You may have the following tests or measurements:  Height, weight, and  BMI.  Blood pressure.  Lipid and cholesterol levels. These may be checked every 5 years, or more frequently if you are over 97 years old.  Skin check.  Lung cancer screening. You may have this screening every year starting at age 64 if you have a 30-pack-year history of smoking and currently smoke or have quit within the past 15 years.  Fecal occult blood test (FOBT) of the stool. You may have this test every year starting at age 72.  Flexible sigmoidoscopy or colonoscopy. You may have a sigmoidoscopy every 5 years or a colonoscopy every 10 years starting at age 51.  Prostate cancer screening. Recommendations will vary depending on your family history and other risks.  Hepatitis C blood test.  Hepatitis B blood test.  Sexually transmitted disease (STD) testing.  Diabetes screening. This is done by checking your blood sugar (glucose) after you have not eaten for a while (fasting). You may have this done every 1-3 years.  Abdominal aortic aneurysm (AAA) screening. You may need this if you are a current or former smoker.  Osteoporosis. You may be screened starting at age 29 if you are at high risk.  Talk with your health care provider about your test results, treatment options, and if necessary, the need for more tests. Vaccines Your health care provider may recommend certain vaccines, such as:  Influenza vaccine. This is recommended every year.  Tetanus, diphtheria, and acellular pertussis (Tdap, Td) vaccine. You may need a Td booster every 10 years.  Varicella vaccine. You may need this if you have not been vaccinated.  Zoster vaccine. You may need this after age 18.  Measles, mumps, and rubella (MMR) vaccine. You may need at least one dose of MMR if you were born in 1957 or later. You may also need a second dose.  Pneumococcal 13-valent conjugate (PCV13) vaccine. One dose is recommended after age 62.  Pneumococcal polysaccharide (PPSV23) vaccine. One dose is recommended  after age 84.  Meningococcal vaccine. You may need this if you have certain conditions.  Hepatitis A vaccine. You may need this if you have certain conditions or if you travel or work in places where you may be exposed to hepatitis A.  Hepatitis B vaccine. You may need this if you have certain conditions or if you travel or work in places where you may be exposed to hepatitis B.  Haemophilus influenzae type b (Hib) vaccine. You may need this if you have certain risk factors.  Talk to your health care provider about which screenings and vaccines you need and how often you need them. This information is not intended to replace advice given to you by your health care provider. Make sure you discuss any questions you have with your health care provider. Document Released: 07/16/2015 Document Revised: 03/08/2016 Document Reviewed: 04/20/2015 Elsevier Interactive Patient Education  [employer].   If you have lab work done today you will be contacted with your lab results within the next 2 weeks.  If you have not heard from [place] then please contact us. The fastest way to get your results is to register for My Chart.   IF you received an x-ray today, you will receive an invoice from [facility]. Please contact [facility] at [phone] with questions  or concerns regarding your invoice.   IF you received labwork today, you will receive an invoice from [place]. Please contact [facility] at [phone] with questions or concerns regarding your invoice.   Our billing staff will not be able to assist you with questions regarding bills from these companies.  You will be contacted with the lab results as soon as they are available. The fastest way to get your results is to activate your My Chart account. Instructions are located on the last page of this paperwork. If you have not heard from [place] regarding the results in 2 weeks, please contact this office.       I personally  performed the services described in this documentation, which was scribed in my presence. The recorded information has been reviewed and considered for accuracy and completeness, addended by me as needed, and agree with information above.  Signed,   Merri Ray, MD [facility] [place].  03/07/18 11:32 AM

## 2018-03-07 NOTE — Patient Instructions (Addendum)
Over the counter vitamin D 2000 units per day as your level was borderline low last year, and that can sometimes occur with use of proton pump inhibitor daily.  Blood sugar was slightly elevated last year, I will screen for diabetes with blood work today.  Shingles vaccine sent to your pharmacy.   Milder deodorant may be helpful for armpit rash, but as we discussed that can be due to a number of causes.  If that rash worsens, I am happy to see you at that time so we can discuss that further.  Ambien if needed, but would recommend initially taking melatonin as that may be safer.  Thank you for coming in today.  Preventive Care 73 Years and Older, Male Preventive care refers to lifestyle choices and visits with your health care provider that can promote health and wellness. What does preventive care include?  A yearly physical exam. This is also called an annual well check.  Dental exams once or twice a year.  Routine eye exams. Ask your health care provider how often you should have your eyes checked.  Personal lifestyle choices, including: ? Daily care of your teeth and gums. ? Regular physical activity. ? Eating a healthy diet. ? Avoiding tobacco and drug use. ? Limiting alcohol use. ? Practicing safe sex. ? Taking low doses of aspirin every day. ? Taking vitamin and mineral supplements as recommended by your health care provider. What happens during an annual well check? The services and screenings done by your health care provider during your annual well check will depend on your age, overall health, lifestyle risk factors, and family history of disease. Counseling Your health care provider may ask you questions about your:  Alcohol use.  Tobacco use.  Drug use.  Emotional well-being.  Home and relationship well-being.  Sexual activity.  Eating habits.  History of falls.  Memory and ability to understand (cognition).  Work and work  Statistician.  Screening You may have the following tests or measurements:  Height, weight, and BMI.  Blood pressure.  Lipid and cholesterol levels. These may be checked every 5 years, or more frequently if you are over 21 years old.  Skin check.  Lung cancer screening. You may have this screening every year starting at age 89 if you have a 30-pack-year history of smoking and currently smoke or have quit within the past 15 years.  Fecal occult blood test (FOBT) of the stool. You may have this test every year starting at age 68.  Flexible sigmoidoscopy or colonoscopy. You may have a sigmoidoscopy every 5 years or a colonoscopy every 10 years starting at age 42.  Prostate cancer screening. Recommendations will vary depending on your family history and other risks.  Hepatitis C blood test.  Hepatitis B blood test.  Sexually transmitted disease (STD) testing.  Diabetes screening. This is done by checking your blood sugar (glucose) after you have not eaten for a while (fasting). You may have this done every 1-3 years.  Abdominal aortic aneurysm (AAA) screening. You may need this if you are a current or former smoker.  Osteoporosis. You may be screened starting at age 39 if you are at high risk.  Talk with your health care provider about your test results, treatment options, and if necessary, the need for more tests. Vaccines Your health care provider may recommend certain vaccines, such as:  Influenza vaccine. This is recommended every year.  Tetanus, diphtheria, and acellular pertussis (Tdap, Td) vaccine. You may need a  Td booster every 10 years.  Varicella vaccine. You may need this if you have not been vaccinated.  Zoster vaccine. You may need this after age 58.  Measles, mumps, and rubella (MMR) vaccine. You may need at least one dose of MMR if you were born in 1957 or later. You may also need a second dose.  Pneumococcal 13-valent conjugate (PCV13) vaccine. One dose is  recommended after age 26.  Pneumococcal polysaccharide (PPSV23) vaccine. One dose is recommended after age 29.  Meningococcal vaccine. You may need this if you have certain conditions.  Hepatitis A vaccine. You may need this if you have certain conditions or if you travel or work in places where you may be exposed to hepatitis A.  Hepatitis B vaccine. You may need this if you have certain conditions or if you travel or work in places where you may be exposed to hepatitis B.  Haemophilus influenzae type b (Hib) vaccine. You may need this if you have certain risk factors.  Talk to your health care provider about which screenings and vaccines you need and how often you need them. This information is not intended to replace advice given to you by your health care provider. Make sure you discuss any questions you have with your health care provider. Document Released: 07/16/2015 Document Revised: 03/08/2016 Document Reviewed: 04/20/2015 Elsevier Interactive Patient Education  [employer].   If you have lab work done today you will be contacted with your lab results within the next 2 weeks.  If you have not heard from [place] then please contact us. The fastest way to get your results is to register for My Chart.   IF you received an x-ray today, you will receive an invoice from [facility]. Please contact [facility] at [phone] with questions or concerns regarding your invoice.   IF you received labwork today, you will receive an invoice from [place]. Please contact [facility] at [phone] with questions or concerns regarding your invoice.   Our billing staff will not be able to assist you with questions regarding bills from these companies.  You will be contacted with the lab results as soon as they are available. The fastest way to get your results is to activate your My Chart account. Instructions are located on the last page of this paperwork. If you have not  heard from [place] regarding the results in 2 weeks, please contact this office.

## 2018-03-08 LAB — LIPID PANEL
Chol/HDL Ratio: 2.5 ratio (ref 0.0–5.0)
Cholesterol, Total: 159 mg/dL (ref 100–199)
HDL: 63 mg/dL (ref >39)
LDL CALC: 78 mg/dL (ref 0–99)
Triglycerides: 91 mg/dL (ref 0–149)
VLDL CHOLESTEROL CAL: 18 mg/dL (ref 5–40)

## 2018-03-08 LAB — COMPREHENSIVE METABOLIC PANEL
A/G RATIO: 1.6 (ref 1.2–2.2)
ALBUMIN: 4.1 g/dL (ref 3.5–4.8)
ALK PHOS: 77 IU/L (ref 39–117)
ALT: 31 IU/L (ref 0–44)
AST: 24 IU/L (ref 0–40)
BILIRUBIN TOTAL: 0.7 mg/dL (ref 0.0–1.2)
BUN/Creatinine Ratio: 11 (ref 10–24)
BUN: 9 mg/dL (ref 8–27)
CHLORIDE: 102 mmol/L (ref 96–106)
CO2: 22 mmol/L (ref 20–29)
Calcium: 9.2 mg/dL (ref 8.6–10.2)
Creatinine, Ser: 0.79 mg/dL (ref 0.76–1.27)
GFR calc non Af Amer: 89 mL/min/{1.73_m2} (ref >59)
GFR, EST AFRICAN AMERICAN: 103 mL/min/{1.73_m2} (ref >59)
GLUCOSE: 105 mg/dL — AB (ref 65–99)
Globulin, Total: 2.6 g/dL (ref 1.5–4.5)
POTASSIUM: 4.4 mmol/L (ref 3.5–5.2)
SODIUM: 141 mmol/L (ref 134–144)
Total Protein: 6.7 g/dL (ref 6.0–8.5)

## 2018-03-08 LAB — PSA: Prostate Specific Ag, Serum: 0.6 ng/mL (ref 0.0–4.0)

## 2018-03-08 LAB — HEMOGLOBIN A1C
Est. average glucose Bld gHb Est-mCnc: 108 mg/dL
Hgb A1c MFr Bld: 5.4 % (ref 4.8–5.6)

## 2018-03-19 ENCOUNTER — Other Ambulatory Visit: Payer: Self-pay | Admitting: Family Medicine

## 2018-03-19 DIAGNOSIS — N529 Male erectile dysfunction, unspecified: Secondary | ICD-10-CM

## 2018-03-29 DIAGNOSIS — L570 Actinic keratosis: Secondary | ICD-10-CM | POA: Diagnosis not present

## 2018-05-18 ENCOUNTER — Other Ambulatory Visit: Payer: Self-pay | Admitting: Family Medicine

## 2018-05-20 NOTE — Telephone Encounter (Signed)
Patient called and he says he is taking Atorvastatin. I advised it is no longer on profile and was removed in August, 2019. He says I had my blood work and is still taking it. I advised I will send to Dr. Nyoka Cowden for refill.

## 2018-05-20 NOTE — Telephone Encounter (Signed)
Requested medication (s) are due for refill today: Yes  Requested medication (s) are on the active medication list: No  Last refill:  10/2017  Future visit scheduled: Yes  Notes to clinic:  Med removed from profile, unable to refill.     Requested Prescriptions  Pending Prescriptions Disp Refills   atorvastatin (LIPITOR) 20 MG tablet [Pharmacy Med Name: ATORVASTATIN 20 MG TABLET] 90 tablet 2    Sig: TAKE ONE TABLET BY MOUTH DAILY     Cardiovascular:  Antilipid - Statins Passed - 05/20/2018 11:51 AM      Passed - Total Cholesterol in normal range and within 360 days    Cholesterol, Total  Date Value Ref Range Status  03/07/2018 159 100 - 199 mg/dL Final         Passed - LDL in normal range and within 360 days    LDL Calculated  Date Value Ref Range Status  03/07/2018 78 0 - 99 mg/dL Final         Passed - HDL in normal range and within 360 days    HDL  Date Value Ref Range Status  03/07/2018 63 >39 mg/dL Final         Passed - Triglycerides in normal range and within 360 days    Triglycerides  Date Value Ref Range Status  03/07/2018 91 0 - 149 mg/dL Final         Passed - Patient is not pregnant      Passed - Valid encounter within last 12 months    Recent Outpatient Visits          2 months ago Medicare annual wellness visit, subsequent   [facility], Ranell Patrick, MD   10 months ago Cough   [facility], Lilia Argue, MD   1 year ago Medicare annual wellness visit, subsequent   [facility], Ranell Patrick, MD   2 years ago Medicare annual wellness visit, subsequent   [facility], Ranell Patrick, MD   3 years ago Annual physical exam   [facility], Ranell Patrick, MD      Future Appointments            In 10 months Carlota Raspberry Ranell Patrick, MD [facility] at [place], [facility]

## 2018-05-20 NOTE — Telephone Encounter (Signed)
Patient called, left VM to return call to the office about Atorvastatin refill. This medication is not listed on med profile and was removed on 02/19/18. Is the patient still taking this medication?

## 2018-07-29 DIAGNOSIS — L570 Actinic keratosis: Secondary | ICD-10-CM | POA: Diagnosis not present

## 2018-08-01 DIAGNOSIS — Z961 Presence of intraocular lens: Secondary | ICD-10-CM | POA: Diagnosis not present

## 2018-08-01 DIAGNOSIS — H401122 Primary open-angle glaucoma, left eye, moderate stage: Secondary | ICD-10-CM | POA: Diagnosis not present

## 2018-08-01 DIAGNOSIS — H35033 Hypertensive retinopathy, bilateral: Secondary | ICD-10-CM | POA: Diagnosis not present

## 2018-08-01 DIAGNOSIS — H401111 Primary open-angle glaucoma, right eye, mild stage: Secondary | ICD-10-CM | POA: Diagnosis not present

## 2018-08-20 DIAGNOSIS — Z0100 Encounter for examination of eyes and vision without abnormal findings: Secondary | ICD-10-CM | POA: Diagnosis not present

## 2018-10-04 DIAGNOSIS — Z0100 Encounter for examination of eyes and vision without abnormal findings: Secondary | ICD-10-CM | POA: Diagnosis not present

## 2018-11-11 ENCOUNTER — Other Ambulatory Visit: Payer: Self-pay | Admitting: Family Medicine

## 2018-11-11 DIAGNOSIS — N529 Male erectile dysfunction, unspecified: Secondary | ICD-10-CM

## 2018-11-11 DIAGNOSIS — G4700 Insomnia, unspecified: Secondary | ICD-10-CM

## 2018-11-11 NOTE — Telephone Encounter (Signed)
Requested medication (s) are due for refill today -yes  Requested medication (s) are on the active medication list -yes  Future visit scheduled -yes  Last refill: 03/07/18  Notes to clinic: Patient is requesting a refill of a non delegated Rx- request sent for PCP review   Requested Prescriptions  Pending Prescriptions Disp Refills   zolpidem (AMBIEN) 5 MG tablet [Pharmacy Med Name: ZOLPIDEM TARTRATE 5 MG TABLET] 30 tablet 0    Sig: TAKE ONE-HALF OR ONE WHOLE TABLET BY MOUTH EVERY NIGHT AT BEDTIME AS NEEDED FOR SLEEP     Not Delegated - Psychiatry:  Anxiolytics/Hypnotics Failed - 11/11/2018  2:00 PM      Failed - This refill cannot be delegated      Failed - Urine Drug Screen completed in last 360 days.      Failed - Valid encounter within last 6 months    Recent Outpatient Visits          8 months ago Medicare annual wellness visit, subsequent   [facility], Ranell Patrick, MD   1 year ago Cough   [facility], Lilia Argue, MD   1 year ago Medicare annual wellness visit, subsequent   [facility], Ranell Patrick, MD   2 years ago Medicare annual wellness visit, subsequent   [facility], Ranell Patrick, MD   3 years ago Annual physical exam   [facility], Ranell Patrick, MD      Future Appointments            In 4 months [name], MD [facility] at [place], [facility]         Signed Prescriptions Disp Refills   sildenafil (REVATIO) 20 MG tablet 50 tablet 0    Sig: TAKE ONE TO THREE TABLETS BY MOUTH DAILY PRIOR TO ONSET OF SEXUAL ACTIVITY AS NEEDED     Urology: Erectile Dysfunction Agents Passed - 11/11/2018  2:00 PM      Passed - Last BP in normal range    BP Readings from Last 1 Encounters:  03/07/18 124/80         Passed - Valid encounter within last 12 months    Recent Outpatient Visits          8 months ago Medicare annual wellness visit, subsequent   [facility], Ranell Patrick,  MD   1 year ago Cough   [facility], Lilia Argue, MD   1 year ago Medicare annual wellness visit, subsequent   [facility], Ranell Patrick, MD   2 years ago Medicare annual wellness visit, subsequent   [facility], Ranell Patrick, MD   3 years ago Annual physical exam   [facility], Ranell Patrick, MD      Future Appointments            In 4 months Carlota Raspberry Ranell Patrick, MD [facility] at [place], [facility]            Requested Prescriptions  Pending Prescriptions Disp Refills   zolpidem (AMBIEN) 5 MG tablet [Pharmacy Med Name: ZOLPIDEM TARTRATE 5 MG TABLET] 30 tablet 0    Sig: TAKE ONE-HALF OR ONE WHOLE TABLET BY MOUTH EVERY NIGHT AT BEDTIME AS NEEDED FOR SLEEP     Not Delegated - Psychiatry:  Anxiolytics/Hypnotics Failed - 11/11/2018  2:00 PM      Failed -  This refill cannot be delegated      Failed - Urine Drug Screen completed in last 360 days.      Failed - Valid encounter within last 6 months    Recent Outpatient Visits          8 months ago Medicare annual wellness visit, subsequent   [facility], Ranell Patrick, MD   1 year ago Cough   [facility], Lilia Argue, MD   1 year ago Medicare annual wellness visit, subsequent   [facility], Ranell Patrick, MD   2 years ago Medicare annual wellness visit, subsequent   [facility], Ranell Patrick, MD   3 years ago Annual physical exam   [facility] at [place], MD      Future Appointments            In 4 months [name], MD Primary [facility] at [place], [facility]         Signed Prescriptions Disp Refills   sildenafil (REVATIO) 20 MG tablet 50 tablet 0    Sig: TAKE ONE TO THREE TABLETS BY MOUTH DAILY PRIOR TO ONSET OF SEXUAL ACTIVITY AS NEEDED     Urology: Erectile Dysfunction Agents Passed - 11/11/2018  2:00 PM      Passed - Last BP in normal range    BP Readings from Last 1 Encounters:  03/07/18  124/80         Passed - Valid encounter within last 12 months    Recent Outpatient Visits          8 months ago Medicare annual wellness visit, subsequent   [facility], Ranell Patrick, MD   1 year ago Cough   [facility], Lilia Argue, MD   1 year ago Medicare annual wellness visit, subsequent   [facility], Ranell Patrick, MD   2 years ago Medicare annual wellness visit, subsequent   [facility], Ranell Patrick, MD   3 years ago Annual physical exam   [facility], Ranell Patrick, MD      Future Appointments            In 4 months Carlota Raspberry Ranell Patrick, MD Primary Care at [place], [facility]

## 2018-11-14 ENCOUNTER — Other Ambulatory Visit: Payer: Self-pay | Admitting: Family Medicine

## 2018-11-25 ENCOUNTER — Other Ambulatory Visit: Payer: Self-pay | Admitting: Family Medicine

## 2018-11-25 DIAGNOSIS — N529 Male erectile dysfunction, unspecified: Secondary | ICD-10-CM

## 2018-12-16 ENCOUNTER — Telehealth (INDEPENDENT_AMBULATORY_CARE_PROVIDER_SITE_OTHER): Payer: Medicare HMO | Admitting: Family Medicine

## 2018-12-16 ENCOUNTER — Ambulatory Visit: Payer: Self-pay | Admitting: Family Medicine

## 2018-12-16 ENCOUNTER — Other Ambulatory Visit: Payer: Self-pay

## 2018-12-16 DIAGNOSIS — Z7189 Other specified counseling: Secondary | ICD-10-CM | POA: Diagnosis not present

## 2018-12-16 DIAGNOSIS — G4700 Insomnia, unspecified: Secondary | ICD-10-CM

## 2018-12-16 MED ORDER — ZOLPIDEM TARTRATE 5 MG PO TABS: 2.5000 mg | ORAL_TABLET | Freq: Every evening | ORAL | 0 refills | Status: DC | PRN

## 2018-12-16 NOTE — Progress Notes (Signed)
CC- Covid Exposure- Patient stated he was exposed to someone at a restaurant at [facility] and they closed the restaurant not too long after we left due to some was positive with covid. Patient have not develop no  Symptoms at this time.  2# also will like a refill on my Ambien 5 mg for sleep

## 2018-12-16 NOTE — Progress Notes (Signed)
Virtual Visit via Telephone Note  I connected with Stephen Conway on 12/16/18 at 3:41 PM by telephone and verified that I am speaking with the correct person using two identifiers.   I discussed the limitations, risks, security and privacy concerns of performing an evaluation and management service by telephone and the availability of in person appointments. I also discussed with the patient that there may be a patient responsible charge related to this service. The patient expressed understanding and agreed to proceed, consent obtained  Chief complaint:  Possible covid exposure and insomnia   History of Present Illness: Stephen Conway is a 73 y.o. male  Possible Covid exposure: Has place in [place]. Went to [employer] on June 11th. Sat outside in fresh air. Masks on except when eating. Wait staff were all wearing gloves and masks. Did let waiter pour wine. Tables were spaced, open air, plenty of circulation. Read in paper that employee tested positive and restaurant shut down next day. Called restaurant but no call back to determine if waiter was involved party.   Has no personal symptoms, he and is wife feel great and friends also feel ok. No symptoms.    Insomnia: Rare use of Ambien - 1/2 pill every 10-14 days. No new side effects.  Last Rx of Ambien 5mg  filled 04/16/18.     Patient Active Problem List   Diagnosis Date Noted  . Rosacea 02/24/2016  . Hyperlipidemia 02/24/2016  . Erectile dysfunction 02/03/2013  . Lipids abnormal 09/25/2011  . Personal history of colonic polyps 09/26/2010  . Barrett's esophagus 09/26/2010   Past Medical History:  Diagnosis Date  . Adenomatous polyp   . Allergy   . Barrett's esophagus 08/2006  . Cataract   . GERD (gastroesophageal reflux disease)   . Glaucoma   . Hyperlipidemia   . Internal hemorrhoids 08/2005   Past Surgical History:  Procedure Laterality Date  . CATARACT EXTRACTION    . COLONOSCOPY    .  EYE SURGERY    . HEMORRHOID SURGERY    . KNEE ARTHROSCOPY    . UPPER GASTROINTESTINAL ENDOSCOPY    . VASECTOMY     No Known Allergies Prior to Admission medications   Medication Sig Start Date End Date Taking? Authorizing Provider  atorvastatin (LIPITOR) 20 MG tablet TAKE ONE TABLET BY MOUTH DAILY 11/14/18  Yes [name], MD  fluticasone [facility]) 50 MCG/ACT nasal spray Place 1 spray into both nostrils daily. Patient taking differently: Place 1 spray into both nostrils daily as needed.  06/28/17  Yes Rutherford Guys, MD  Glucosamine-Chondroit-Vit C-Mn (GLUCOSAMINE 1500 COMPLEX PO) Take by mouth.   Yes [provider]  Homeopathic Products (PROSACEA) GEL Apply topically as needed.   Yes [provider]  omeprazole (PRILOSEC) 20 MG capsule Take 1 capsule (20 mg total) by mouth daily. 02/19/18  Yes Ladene Artist, MD  sildenafil (REVATIO) 20 MG tablet TAKE ONE TO THREE TABLETS BY MOUTH DAILY PRIOR TO ONSET OF SEXUAL ACTIVITY AS NEEDED 11/25/18  Yes [name], MD  zolpidem (AMBIEN) 5 MG tablet Take 0.5-1 tablets (2.5-5 mg total) by mouth at bedtime as needed for sleep. 03/07/18  Yes [name], MD  zaleplon (SONATA) 10 MG capsule 10 mg. One tablet by mouth once a week   09/25/11  [provider]   Social History   Socioeconomic History  . Marital status: Single    Spouse name: Not on file  . Number of  children: 2  . Years of education: Not on file  . Highest education level: Not on file  Occupational History  . Occupation: Retired  Scientific laboratory technician  . Financial resource strain: Not on file  . Food insecurity    Worry: Not on file    Inability: Not on file  . Transportation needs    Medical: Not on file    Non-medical: Not on file  Tobacco Use  . Smoking status: Former Smoker    Types: Cigars  . Smokeless tobacco: Never Used  . Tobacco comment: per pt-smoke a cigar every month  Substance and Sexual Activity  . Alcohol use: Yes     Alcohol/week: 14.0 standard drinks    Types: 14 Glasses of wine per week    Comment: couple glasses of wine nightly  . Drug use: No  . Sexual activity: Not on file  Lifestyle  . Physical activity    Days per week: Not on file    Minutes per session: Not on file  . Stress: Not on file  Relationships  . Social Herbalist on phone: Not on file    Gets together: Not on file    Attends religious service: Not on file    Active member of club or organization: Not on file    Attends meetings of clubs or organizations: Not on file    Relationship status: Not on file  . Intimate partner violence    Fear of current or ex partner: Not on file    Emotionally abused: Not on file    Physically abused: Not on file    Forced sexual activity: Not on file  Other Topics Concern  . Not on file  Social History Narrative  . Not on file     Observations/Objective: Speaking normally, no distress. Understanding expressed of plan with all questions answered  Assessment and Plan: Advice Given About Covid-19 Virus by Telephone - Plan:   -Asymptomatic, unknown if definite exposure to COVID-19 person.  Possible with staff, but they were wearing masks, gloves as above.  Lower risk for exposure.  -Continue to isolate as much as possible over the next 10 days while watching for any new symptoms.  If those occur then would order testing.  Understanding of plan expressed.  Insomnia, unspecified type - Plan: zolpidem (AMBIEN) 5 MG tablet,   -Stable with intermittent use of Ambien, 2.5 to 5 mg.  Refill sent.   Follow Up Instructions: Prn, and has appt later in year.    I discussed the assessment and treatment plan with the patient. The patient was provided an opportunity to ask questions and all were answered. The patient agreed with the plan and demonstrated an understanding of the instructions.   The patient was advised to call back or seek an in-person evaluation if the symptoms worsen or if  the condition fails to improve as anticipated.  I provided 15 minutes of non-face-to-face time during this encounter.  Signed,   Merri Ray, MD [facility] at [place].  12/16/18

## 2018-12-16 NOTE — Telephone Encounter (Signed)
Had a Telmed with Dr. Carlota Raspberry today for evaluation

## 2018-12-16 NOTE — Telephone Encounter (Signed)
Pt. Reports he had a possible exposure to COVID 19 last Thursday at a restaurant in [place]. [facility] to [facility] the practice.   Answer Assessment - Initial Assessment Questions 1. CLOSE CONTACT: "Who is the person with the confirmed or suspected COVID-19 infection that you were exposed to?"     Another person tested positive 2. PLACE of CONTACT: "Where were you when you were exposed to COVID-19?" (e.g., home, school, medical waiting room; which city?)     Restaurant in [place]  3. TYPE of CONTACT: "How much contact was there?" (e.g., sitting next to, live in same house, work in same office, same building)     Unsure 4. DURATION of CONTACT: "How long were you in contact with the COVID-19 patient?" (e.g., a few seconds, passed by person, a few minutes, live with the patient)     Unsure 5. DATE of CONTACT: "When did you have contact with a COVID-19 patient?" (e.g., how many days ago)     Last Thursday 6. TRAVEL: "Have you traveled out of the country recently?" If so, "When and where?"     * Also ask about out-of-state travel, since the CDC has identified some high-risk cities for community spread in the [place].     * Note: Travel becomes less relevant if there is widespread community transmission where the patient lives.     No 7. COMMUNITY SPREAD: "Are there lots of cases of COVID-19 (community spread) where you live?" (See public health department website, if unsure)       Yes 8. SYMPTOMS: "Do you have any symptoms?" (e.g., fever, cough, breathing difficulty)     No 9. PREGNANCY OR POSTPARTUM: "Is there any chance you are pregnant?" "When was your last menstrual period?" "Did you deliver in the last 2 weeks?"     n/a 10. HIGH RISK: "Do you have any heart or lung problems? Do you have a weak immune system?" (e.g., CHF, COPD, asthma, HIV positive, chemotherapy, renal failure, diabetes mellitus, sickle cell anemia)       No  Protocols used: CORONAVIRUS (COVID-19)  EXPOSURE-A-AH

## 2018-12-16 NOTE — Patient Instructions (Addendum)
  Without any symptoms and without specific known exposure, I do not feel that COVID testing is absolutely necessary at this time.  I do recommend isolation as much as possible over the next 10 days while you watch for any symptoms to develop.  If you do develop symptoms, let me know right away and I will order some testing.  Please let me know if there are questions.     If you have lab work done today you will be contacted with your lab results within the next 2 weeks.  If you have not heard from [place] then please contact us. The fastest way to get your results is to register for My Chart.   IF you received an x-ray today, you will receive an invoice from [facility]. Please contact [facility] at [phone] with questions or concerns regarding your invoice.   IF you received labwork today, you will receive an invoice from [facility]. Please contact [facility] at [phone] with questions or concerns regarding your invoice.   Our billing staff will not be able to assist you with questions regarding bills from these companies.  You will be contacted with the lab results as soon as they are available. The fastest way to get your results is to activate your My Chart account. Instructions are located on the last page of this paperwork. If you have not heard from [place] regarding the results in 2 weeks, please contact this office.

## 2019-02-10 ENCOUNTER — Other Ambulatory Visit: Payer: Self-pay

## 2019-02-10 MED ORDER — OMEPRAZOLE 20 MG PO CPDR: 20.0000 mg | DELAYED_RELEASE_CAPSULE | Freq: Every day | ORAL | 0 refills | Status: DC

## 2019-03-17 ENCOUNTER — Encounter: Payer: Medicare HMO | Admitting: Family Medicine

## 2019-03-18 DIAGNOSIS — L819 Disorder of pigmentation, unspecified: Secondary | ICD-10-CM | POA: Diagnosis not present

## 2019-03-18 DIAGNOSIS — D229 Melanocytic nevi, unspecified: Secondary | ICD-10-CM | POA: Diagnosis not present

## 2019-03-18 DIAGNOSIS — L821 Other seborrheic keratosis: Secondary | ICD-10-CM | POA: Diagnosis not present

## 2019-03-18 DIAGNOSIS — D1801 Hemangioma of skin and subcutaneous tissue: Secondary | ICD-10-CM | POA: Diagnosis not present

## 2019-03-18 DIAGNOSIS — L814 Other melanin hyperpigmentation: Secondary | ICD-10-CM | POA: Diagnosis not present

## 2019-03-18 DIAGNOSIS — L570 Actinic keratosis: Secondary | ICD-10-CM | POA: Diagnosis not present

## 2019-03-21 DIAGNOSIS — H401122 Primary open-angle glaucoma, left eye, moderate stage: Secondary | ICD-10-CM | POA: Diagnosis not present

## 2019-03-21 DIAGNOSIS — H401111 Primary open-angle glaucoma, right eye, mild stage: Secondary | ICD-10-CM | POA: Diagnosis not present

## 2019-03-25 ENCOUNTER — Encounter: Payer: Medicare HMO | Admitting: Family Medicine

## 2019-03-27 ENCOUNTER — Other Ambulatory Visit: Payer: Self-pay

## 2019-03-27 ENCOUNTER — Encounter: Payer: Self-pay | Admitting: Family Medicine

## 2019-03-27 ENCOUNTER — Ambulatory Visit (INDEPENDENT_AMBULATORY_CARE_PROVIDER_SITE_OTHER): Payer: Medicare HMO | Admitting: Family Medicine

## 2019-03-27 VITALS — BP 145/78 | HR 90 | Temp 97.8°F | Resp 14 | Wt 203.2 lb

## 2019-03-27 DIAGNOSIS — N529 Male erectile dysfunction, unspecified: Secondary | ICD-10-CM

## 2019-03-27 DIAGNOSIS — Z131 Encounter for screening for diabetes mellitus: Secondary | ICD-10-CM | POA: Diagnosis not present

## 2019-03-27 DIAGNOSIS — R030 Elevated blood-pressure reading, without diagnosis of hypertension: Secondary | ICD-10-CM

## 2019-03-27 DIAGNOSIS — Z125 Encounter for screening for malignant neoplasm of prostate: Secondary | ICD-10-CM

## 2019-03-27 DIAGNOSIS — E785 Hyperlipidemia, unspecified: Secondary | ICD-10-CM

## 2019-03-27 DIAGNOSIS — Z23 Encounter for immunization: Secondary | ICD-10-CM

## 2019-03-27 DIAGNOSIS — G4700 Insomnia, unspecified: Secondary | ICD-10-CM | POA: Diagnosis not present

## 2019-03-27 DIAGNOSIS — Z0000 Encounter for general adult medical examination without abnormal findings: Secondary | ICD-10-CM

## 2019-03-27 DIAGNOSIS — Z0001 Encounter for general adult medical examination with abnormal findings: Secondary | ICD-10-CM

## 2019-03-27 MED ORDER — ATORVASTATIN CALCIUM 20 MG PO TABS: 20.0000 mg | ORAL_TABLET | Freq: Every day | ORAL | 3 refills | Status: DC

## 2019-03-27 NOTE — Patient Instructions (Addendum)
Cut back to no more than 1-2 alcoholic drinks per day.  Keep a record of your blood pressures outside of the office and if over 140/90 - please schedule appointment.   Small amount of earwax on the right.  If that blocks up, can try Debrox over-the-counter or return to office to have that removed.  No med changes today.  Follow-up in 1 year, let me know if there are questions sooner.     Preventive Care 74 Years and Older, Male Preventive care refers to lifestyle choices and visits with your health care provider that can promote health and wellness. This includes:  A yearly physical exam. This is also called an annual well check.  Regular dental and eye exams.  Immunizations.  Screening for certain conditions.  Healthy lifestyle choices, such as diet and exercise. What can I expect for my preventive care visit? Physical exam Your health care provider will check:  Height and weight. These may be used to calculate body mass index (BMI), which is a measurement that tells if you are at a healthy weight.  Heart rate and blood pressure.  Your skin for abnormal spots. Counseling Your health care provider may ask you questions about:  Alcohol, tobacco, and drug use.  Emotional well-being.  Home and relationship well-being.  Sexual activity.  Eating habits.  History of falls.  Memory and ability to understand (cognition).  Work and work Statistician. What immunizations do I need?  Influenza (flu) vaccine  This is recommended every year. Tetanus, diphtheria, and pertussis (Tdap) vaccine  You may need a Td booster every 10 years. Varicella (chickenpox) vaccine  You may need this vaccine if you have not already been vaccinated. Zoster (shingles) vaccine  You may need this after age 74. Pneumococcal conjugate (PCV13) vaccine  One dose is recommended after age 74. Pneumococcal polysaccharide (PPSV23) vaccine  One dose is recommended after age 74. Measles, mumps,  and rubella (MMR) vaccine  You may need at least one dose of MMR if you were born in 1957 or later. You may also need a second dose. Meningococcal conjugate (MenACWY) vaccine  You may need this if you have certain conditions. Hepatitis A vaccine  You may need this if you have certain conditions or if you travel or work in places where you may be exposed to hepatitis A. Hepatitis B vaccine  You may need this if you have certain conditions or if you travel or work in places where you may be exposed to hepatitis B. Haemophilus influenzae type b (Hib) vaccine  You may need this if you have certain conditions. You may receive vaccines as individual doses or as more than one vaccine together in one shot (combination vaccines). Talk with your health care provider about the risks and benefits of combination vaccines. What tests do I need? Blood tests  Lipid and cholesterol levels. These may be checked every 5 years, or more frequently depending on your overall health.  Hepatitis C test.  Hepatitis B test. Screening  Lung cancer screening. You may have this screening every year starting at age 74 if you have a 30-pack-year history of smoking and currently smoke or have quit within the past 15 years.  Colorectal cancer screening. All adults should have this screening starting at age 74 and continuing until age 48. Your health care provider may recommend screening at age 40 if you are at increased risk. You will have tests every 1-10 years, depending on your results and the type of screening  test.  Prostate cancer screening. Recommendations will vary depending on your family history and other risks.  Diabetes screening. This is done by checking your blood sugar (glucose) after you have not eaten for a while (fasting). You may have this done every 1-3 years.  Abdominal aortic aneurysm (AAA) screening. You may need this if you are a current or former smoker.  Sexually transmitted disease (STD)  testing. Follow these instructions at home: Eating and drinking  Eat a diet that includes fresh fruits and vegetables, whole grains, lean protein, and low-fat dairy products. Limit your intake of foods with high amounts of sugar, saturated fats, and salt.  Take vitamin and mineral supplements as recommended by your health care provider.  Do not drink alcohol if your health care provider tells you not to drink.  If you drink alcohol: ? Limit how much you have to 0-2 drinks a day. ? Be aware of how much alcohol is in your drink. In the [place] one drink equals one 12 oz bottle of beer (355 mL), one 5 oz glass of wine (148 mL), or one 1 oz glass of hard liquor (44 mL). Lifestyle  Take daily care of your teeth and gums.  Stay active. Exercise for at least 30 minutes on 5 or more days each week.  Do not use any products that contain nicotine or tobacco, such as cigarettes, e-cigarettes, and chewing tobacco. If you need help quitting, ask your health care provider.  If you are sexually active, practice safe sex. Use a condom or other form of protection to prevent STIs (sexually transmitted infections).  Talk with your health care provider about taking a low-dose aspirin or statin. What's next?  Visit your health care provider once a year for a well check visit.  Ask your health care provider how often you should have your eyes and teeth checked.  Stay up to date on all vaccines. This information is not intended to replace advice given to you by your health care provider. Make sure you discuss any questions you have with your health care provider. Document Released: 07/16/2015 Document Revised: 06/13/2018 Document Reviewed: 06/13/2018 Elsevier Patient Education  2020 [place], Adult The ears produce a substance called earwax that helps keep bacteria out of the ear and protects the skin in the ear canal. Occasionally, earwax can build up in the ear and cause  discomfort or hearing loss. What increases the risk? This condition is more likely to develop in people who:  Are male.  Are elderly.  Naturally produce more earwax.  Clean their ears often with cotton swabs.  Use earplugs often.  Use in-ear headphones often.  Wear hearing aids.  Have narrow ear canals.  Have earwax that is overly thick or sticky.  Have eczema.  Are dehydrated.  Have excess hair in the ear canal. What are the signs or symptoms? Symptoms of this condition include:  Reduced or muffled hearing.  A feeling of fullness in the ear or feeling that the ear is plugged.  Fluid coming from the ear.  Ear pain.  Ear itch.  Ringing in the ear.  Coughing.  An obvious piece of earwax that can be seen inside the ear canal. How is this diagnosed? This condition may be diagnosed based on:  Your symptoms.  Your medical history.  An ear exam. During the exam, your health care provider will look into your ear with an instrument called an otoscope. You may have tests, including a  hearing test. How is this treated? This condition may be treated by:  Using ear drops to soften the earwax.  Having the earwax removed by a health care provider. The health care provider may: ? Flush the ear with water. ? Use an instrument that has a loop on the end (curette). ? Use a suction device.  Surgery to remove the wax buildup. This may be done in severe cases. Follow these instructions at home:   Take over-the-counter and prescription medicines only as told by your health care provider.  Do not put any objects, including cotton swabs, into your ear. You can clean the opening of your ear canal with a washcloth or facial tissue.  Follow instructions from your health care provider about cleaning your ears. Do not over-clean your ears.  Drink enough fluid to keep your urine clear or pale yellow. This will help to thin the earwax.  Keep all follow-up visits as told by  your health care provider. If earwax builds up in your ears often or if you use hearing aids, consider seeing your health care provider for routine, preventive ear cleanings. Ask your health care provider how often you should schedule your cleanings.  If you have hearing aids, clean them according to instructions from the manufacturer and your health care provider. Contact a health care provider if:  You have ear pain.  You develop a fever.  You have blood, pus, or other fluid coming from your ear.  You have hearing loss.  You have ringing in your ears that does not go away.  Your symptoms do not improve with treatment.  You feel like the room is spinning (vertigo). Summary  Earwax can build up in the ear and cause discomfort or hearing loss.  The most common symptoms of this condition include reduced or muffled hearing and a feeling of fullness in the ear or feeling that the ear is plugged.  This condition may be diagnosed based on your symptoms, your medical history, and an ear exam.  This condition may be treated by using ear drops to soften the earwax or by having the earwax removed by a health care provider.  Do not put any objects, including cotton swabs, into your ear. You can clean the opening of your ear canal with a washcloth or facial tissue. This information is not intended to replace advice given to you by your health care provider. Make sure you discuss any questions you have with your health care provider. Document Released: 07/27/2004 Document Revised: 06/01/2017 Document Reviewed: 08/30/2016 Elsevier Patient Education  [employer].        If you have lab work done today you will be contacted with your lab results within the next 2 weeks.  If you have not heard from [place] then please contact us. The fastest way to get your results is to register for My Chart.   IF you received an x-ray today, you will receive an invoice from [facility]. Please  contact [facility] at [phone] with questions or concerns regarding your invoice.   IF you received labwork today, you will receive an invoice from [place]. Please contact [facility] at [phone] with questions or concerns regarding your invoice.   Our billing staff will not be able to assist you with questions regarding bills from these companies.  You will be contacted with the lab results as soon as they are available. The fastest way to get your results is to activate your My Chart account. Instructions  are located on the last page of this paperwork. If you have not heard from [place] regarding the results in 2 weeks, please contact this office.

## 2019-03-27 NOTE — Progress Notes (Signed)
Subjective:    Patient ID: Stephen Conway, male    DOB: 04-07-1946, 73 y.o.   MRN: [id number]  HPI Stephen Conway is a 73 y.o. male Presents today for: Chief Complaint  Patient presents with  . Annual Exam    Here for annual physical. Would like to talk about the sec shingles shot  Here for annual wellness exam.  Care team: Gastroenterology, Dr. Fuller Plan Dermatology, Dr. Pearline Cables, [facility], Dr. Kathlen Mody. hypertensive retinopathy, glaucoma  Insomnia: Episodic/rare use of Ambien 52m. Last filled for #30 on June 17, previously April 16, 2018 for #30. Has also used over-the-counter melatonin that usually works.  1/2 pill few times per month.   GERD with Barrett's esophagus GI, Dr. SFuller Plan  On omeprazole 20 mg daily. Controlling symptoms.   Hyperglycemia: Normal A1c last year Lab Results  Component Value Date   HGBA1C 5.4 03/07/2018   Hyperlipidemia: Lipitor 20 mg daily. No new side effects with meds Lab Results  Component Value Date   CHOL 159 03/07/2018   HDL 63 03/07/2018   LDLCALC 78 03/07/2018   TRIG 91 03/07/2018   CHOLHDL 2.5 03/07/2018   Lab Results  Component Value Date   ALT 31 03/07/2018   AST 24 03/07/2018   ALKPHOS 77 03/07/2018   BILITOT 0.7 03/07/2018   Erectile dysfunction: Sildenafil 20 mg used previously.  Last refilled in May. Taking 3 per occurrence approx once per week.no blue color to vision, no hearing changes. No CP with exercise/exertion.   Cancer screening:  Colonoscopy July 2018, repeat in 5 years. Normal PSA, DRE at physical in 2019. After R/B/A would like to have checked.   Flonase as needed for allergies.   Immunization History  Administered Date(s) Administered  . Fluad Quad(high Dose 65+) 03/27/2019  . Hepatitis A 02/24/1997, 02/18/2004  . Hepatitis A, Adult 07/28/2004  . Influenza, High Dose Seasonal PF 03/07/2018  . Influenza,inj,Quad PF,6+ Mos 02/24/2016, 03/01/2017  . Influenza-Unspecified 03/28/2014  . MMR  02/18/2004  . Pneumococcal Conjugate-13 01/19/2014  . Pneumococcal Polysaccharide-23 08/10/2010  . Td 07/03/2002  . Tdap 01/19/2014  Shingrix, status post one, second one pending.   Fall Screening: 1 fall on loose shower rug, no significant injury. Removed loose rug. Appropriate lighting in the home, stairs - 1 flight to office, lives on 1st floor primarily.   [facility] 03/27/2019 12/16/2018 03/07/2018 06/28/2017 03/01/2017  Decreased Interest 0 0 0 0 0  Down, Depressed, Hopeless 0 0 0 0 0  PHQ - 2 Score 0 0 0 0 0   Functional Status Survey: Is the patient deaf or have difficulty hearing?: No Does the patient have difficulty seeing, even when wearing glasses/contacts?: No Does the patient have difficulty concentrating, remembering, or making decisions?: No Does the patient have difficulty walking or climbing stairs?: No Does the patient have difficulty dressing or bathing?: No Does the patient have difficulty doing errands alone such as visiting a doctor's office or shopping?: No   6CIT Screen 03/27/2019 03/07/2018  What Year? 0 points 0 points  What month? 0 points 0 points  What time? 0 points 0 points  Count back from 20 0 points 0 points  Months in reverse 0 points 0 points  Repeat phrase 0 points 0 points  Total Score 0 0     Hearing Screening   _0  _1  _2  _3  _4  _5  _6  _7  _8   Right ear:           Left ear:  Visual Acuity Screening   Right eye Left eye Both eyes  Without correction:     With correction: _0  last appt 3 days ago. Back on drops for glaucoma on left eye.   Dental: next week.   Exercise: tennis 3 days per week, golf 3 days per week.   Alcohol: 3 glasses wine at dinner - 6 days per week. No DUI, no difficulty cutting back.  Tobacco: none. Rare cigar - once per year.   Advanced directives.  Has a living will and DNR  Patient Active Problem List   Diagnosis Date Noted  . Rosacea  02/24/2016  . Hyperlipidemia 02/24/2016  . Erectile dysfunction 02/03/2013  . Lipids abnormal 09/25/2011  . Personal history of colonic polyps 09/26/2010  . Barrett's esophagus 09/26/2010   Past Medical History:  Diagnosis Date  . Adenomatous polyp   . Allergy   . Barrett's esophagus 08/2006  . Cataract   . GERD (gastroesophageal reflux disease)   . Glaucoma   . Hyperlipidemia   . Internal hemorrhoids 08/2005   Past Surgical History:  Procedure Laterality Date  . CATARACT EXTRACTION    . COLONOSCOPY    . EYE SURGERY    . HEMORRHOID SURGERY    . KNEE ARTHROSCOPY    . UPPER GASTROINTESTINAL ENDOSCOPY    . VASECTOMY     No Known Allergies Prior to Admission medications   Medication Sig Start Date End Date Taking? Authorizing Provider  atorvastatin (LIPITOR) 20 MG tablet TAKE ONE TABLET BY MOUTH DAILY 11/14/18  Yes [name], MD  Fluorouracil (TOLAK) 4 % CREA Apply topically.   Yes [provider]  fluticasone (FLONASE) 50 MCG/ACT nasal spray Place 1 spray into both nostrils daily. Patient taking differently: Place 1 spray into both nostrils daily as needed.  06/28/17  Yes Rutherford Guys, MD  Glucosamine-Chondroit-Vit C-Mn (GLUCOSAMINE 1500 COMPLEX PO) Take by mouth.   Yes [provider]  Homeopathic Products (PROSACEA) GEL Apply topically as needed.   Yes [provider]  omeprazole (PRILOSEC) 20 MG capsule Take 1 capsule (20 mg total) by mouth daily. 02/10/19  Yes Ladene Artist, MD  sildenafil (REVATIO) 20 MG tablet TAKE ONE TO THREE TABLETS BY MOUTH DAILY PRIOR TO ONSET OF SEXUAL ACTIVITY AS NEEDED 11/25/18  Yes [name], MD  zolpidem (AMBIEN) 5 MG tablet Take 0.5-1 tablets (2.5-5 mg total) by mouth at bedtime as needed for sleep. 12/16/18  Yes [name], MD  zaleplon (SONATA) 10 MG capsule 10 mg. One tablet by mouth once a week   09/25/11  [provider]   Social History   Socioeconomic History  . Marital  status: Single    Spouse name: Not on file  . Number of children: 2  . Years of education: Not on file  . Highest education level: Not on file  Occupational History  . Occupation: Retired  Scientific laboratory technician  . Financial resource strain: Not on file  . Food insecurity    Worry: Not on file    Inability: Not on file  . Transportation needs    Medical: Not on file    Non-medical: Not on file  Tobacco Use  . Smoking status: Former Smoker    Types: Cigars  . Smokeless tobacco: Never Used  . Tobacco comment: per pt-smoke a cigar every month  Substance and Sexual Activity  . Alcohol use: Yes    Alcohol/week: 14.0 standard drinks    Types: 14  Glasses of wine per week    Comment: couple glasses of wine nightly  . Drug use: No  . Sexual activity: Not on file  Lifestyle  . Physical activity    Days per week: Not on file    Minutes per session: Not on file  . Stress: Not on file  Relationships  . Social Herbalist on phone: Not on file    Gets together: Not on file    Attends religious service: Not on file    Active member of club or organization: Not on file    Attends meetings of clubs or organizations: Not on file    Relationship status: Not on file  . Intimate partner violence    Fear of current or ex partner: Not on file    Emotionally abused: Not on file    Physically abused: Not on file    Forced sexual activity: Not on file  Other Topics Concern  . Not on file  Social History Narrative  . Not on file    Review of Systems 13 point review of systems per patient health survey noted.  Negative other than as indicated above or in HPI.      Objective:   Physical Exam Vitals signs reviewed.  Constitutional:      Appearance: He is well-developed.  HENT:     Head: Normocephalic and atraumatic.     Right Ear: External ear normal. There is impacted cerumen (increased amt of cerumen, but not completely impacted. ).     Left Ear: External ear normal.  Eyes:      Conjunctiva/sclera: Conjunctivae normal.     Pupils: Pupils are equal, round, and reactive to light.  Neck:     Musculoskeletal: Normal range of motion and neck supple.     Thyroid: No thyromegaly.  Cardiovascular:     Rate and Rhythm: Normal rate and regular rhythm.     Heart sounds: Normal heart sounds.  Pulmonary:     Effort: Pulmonary effort is normal. No respiratory distress.     Breath sounds: Normal breath sounds. No wheezing.  Abdominal:     General: There is no distension.     Palpations: Abdomen is soft.     Tenderness: There is no abdominal tenderness.     Hernia: There is no hernia in the left inguinal area or right inguinal area.  Genitourinary:    Prostate: Normal.  Musculoskeletal: Normal range of motion.        General: No tenderness.  Lymphadenopathy:     Cervical: No cervical adenopathy.  Skin:    General: Skin is warm and dry.  Neurological:     Mental Status: He is alert and oriented to person, place, and time.     Deep Tendon Reflexes: Reflexes are normal and symmetric.  Psychiatric:        Behavior: Behavior normal.    Vitals:   03/27/19 0825  BP: (!) 145/78  Pulse: 90  Resp: 14  Temp: 97.8 F (36.6 C)  TempSrc: Oral  SpO2: 97%  Weight: 203 lb 3.2 oz (92.2 kg)       Assessment & Plan:    Stephen Conway is a 74 y.o. male Medicare annual wellness visit, subsequent  - - anticipatory guidance as below in AVS, screening labs if needed. Health maintenance items as above in HPI discussed/recommended as applicable.  - no concerning responses on depression, fall, or functional status screening. Any positive responses noted as above.  Advanced directives discussed as in CHL.   Need for prophylactic vaccination and inoculation against influenza - Plan: Flu Vaccine QUAD High Dose(Fluad)  Insomnia, unspecified type  -Controlled with melatonin, rare Ambien use.  Okay to refill until physical next year if similar use pattern.  Erectile dysfunction,  unspecified erectile dysfunction type  -treated with sildenafil. lowest effective dose. Side effects discussed (including but not limited to headache/flushing, blue discoloration of vision, possible vascular steal and risk of cardiac effects if underlying unknown coronary artery disease, and permanent sensorineural hearing loss). Understanding expressed.  Screening for prostate cancer - Plan: PSA  -We discussed pros and cons of prostate cancer screening, and after this discussion, he chose to have screening done. PSA obtained, and no concerning findings on DRE.   Screening for diabetes mellitus - Plan: Comprehensive metabolic panel  Elevated blood pressure reading  -Recommended decreased alcohol use to no more than 1-2 drinks per day maximum with home monitoring.  RTC precautions if elevated readings at home.    Hyperlipidemia, unspecified hyperlipidemia type - Plan: Lipid Panel, atorvastatin (LIPITOR) 20 MG tablet  -  Stable, tolerating current regimen. Medications refilled. Labs pending as above.    Meds ordered this encounter  Medications  . atorvastatin (LIPITOR) 20 MG tablet    Sig: Take 1 tablet (20 mg total) by mouth daily.    Dispense:  90 tablet    Refill:  3   Patient Instructions   Cut back to no more than 1-2 alcoholic drinks per day.  Keep a record of your blood pressures outside of the office and if over 140/90 - please schedule appointment.   Small amount of earwax on the right.  If that blocks up, can try Debrox over-the-counter or return to office to have that removed.  No med changes today.  Follow-up in 1 year, let me know if there are questions sooner.     Preventive Care 46 Years and Older, Male Preventive care refers to lifestyle choices and visits with your health care provider that can promote health and wellness. This includes:  A yearly physical exam. This is also called an annual well check.  Regular dental and eye exams.  Immunizations.  Screening  for certain conditions.  Healthy lifestyle choices, such as diet and exercise. What can I expect for my preventive care visit? Physical exam Your health care provider will check:  Height and weight. These may be used to calculate body mass index (BMI), which is a measurement that tells if you are at a healthy weight.  Heart rate and blood pressure.  Your skin for abnormal spots. Counseling Your health care provider may ask you questions about:  Alcohol, tobacco, and drug use.  Emotional well-being.  Home and relationship well-being.  Sexual activity.  Eating habits.  History of falls.  Memory and ability to understand (cognition).  Work and work Statistician. What immunizations do I need?  Influenza (flu) vaccine  This is recommended every year. Tetanus, diphtheria, and pertussis (Tdap) vaccine  You may need a Td booster every 10 years. Varicella (chickenpox) vaccine  You may need this vaccine if you have not already been vaccinated. Zoster (shingles) vaccine  You may need this after age 79. Pneumococcal conjugate (PCV13) vaccine  One dose is recommended after age 68. Pneumococcal polysaccharide (PPSV23) vaccine  One dose is recommended after age 44. Measles, mumps, and rubella (MMR) vaccine  You may need at least one dose of MMR if you were born in 1957 or later.  You may also need a second dose. Meningococcal conjugate (MenACWY) vaccine  You may need this if you have certain conditions. Hepatitis A vaccine  You may need this if you have certain conditions or if you travel or work in places where you may be exposed to hepatitis A. Hepatitis B vaccine  You may need this if you have certain conditions or if you travel or work in places where you may be exposed to hepatitis B. Haemophilus influenzae type b (Hib) vaccine  You may need this if you have certain conditions. You may receive vaccines as individual doses or as more than one vaccine together in  one shot (combination vaccines). Talk with your health care provider about the risks and benefits of combination vaccines. What tests do I need? Blood tests  Lipid and cholesterol levels. These may be checked every 5 years, or more frequently depending on your overall health.  Hepatitis C test.  Hepatitis B test. Screening  Lung cancer screening. You may have this screening every year starting at age 68 if you have a 30-pack-year history of smoking and currently smoke or have quit within the past 15 years.  Colorectal cancer screening. All adults should have this screening starting at age 72 and continuing until age 55. Your health care provider may recommend screening at age 48 if you are at increased risk. You will have tests every 1-10 years, depending on your results and the type of screening test.  Prostate cancer screening. Recommendations will vary depending on your family history and other risks.  Diabetes screening. This is done by checking your blood sugar (glucose) after you have not eaten for a while (fasting). You may have this done every 1-3 years.  Abdominal aortic aneurysm (AAA) screening. You may need this if you are a current or former smoker.  Sexually transmitted disease (STD) testing. Follow these instructions at home: Eating and drinking  Eat a diet that includes fresh fruits and vegetables, whole grains, lean protein, and low-fat dairy products. Limit your intake of foods with high amounts of sugar, saturated fats, and salt.  Take vitamin and mineral supplements as recommended by your health care provider.  Do not drink alcohol if your health care provider tells you not to drink.  If you drink alcohol: ? Limit how much you have to 0-2 drinks a day. ? Be aware of how much alcohol is in your drink. In the [place] one drink equals one 12 oz bottle of beer (355 mL), one 5 oz glass of wine (148 mL), or one 1 oz glass of hard liquor (44 mL). Lifestyle  Take daily  care of your teeth and gums.  Stay active. Exercise for at least 30 minutes on 5 or more days each week.  Do not use any products that contain nicotine or tobacco, such as cigarettes, e-cigarettes, and chewing tobacco. If you need help quitting, ask your health care provider.  If you are sexually active, practice safe sex. Use a condom or other form of protection to prevent STIs (sexually transmitted infections).  Talk with your health care provider about taking a low-dose aspirin or statin. What's next?  Visit your health care provider once a year for a well check visit.  Ask your health care provider how often you should have your eyes and teeth checked.  Stay up to date on all vaccines. This information is not intended to replace advice given to you by your health care provider. Make sure you discuss any questions  you have with your health care provider. Document Released: 07/16/2015 Document Revised: 06/13/2018 Document Reviewed: 06/13/2018 Elsevier Patient Education  2020 [place], Adult The ears produce a substance called earwax that helps keep bacteria out of the ear and protects the skin in the ear canal. Occasionally, earwax can build up in the ear and cause discomfort or hearing loss. What increases the risk? This condition is more likely to develop in people who:  Are male.  Are elderly.  Naturally produce more earwax.  Clean their ears often with cotton swabs.  Use earplugs often.  Use in-ear headphones often.  Wear hearing aids.  Have narrow ear canals.  Have earwax that is overly thick or sticky.  Have eczema.  Are dehydrated.  Have excess hair in the ear canal. What are the signs or symptoms? Symptoms of this condition include:  Reduced or muffled hearing.  A feeling of fullness in the ear or feeling that the ear is plugged.  Fluid coming from the ear.  Ear pain.  Ear itch.  Ringing in the ear.  Coughing.  An  obvious piece of earwax that can be seen inside the ear canal. How is this diagnosed? This condition may be diagnosed based on:  Your symptoms.  Your medical history.  An ear exam. During the exam, your health care provider will look into your ear with an instrument called an otoscope. You may have tests, including a hearing test. How is this treated? This condition may be treated by:  Using ear drops to soften the earwax.  Having the earwax removed by a health care provider. The health care provider may: ? Flush the ear with water. ? Use an instrument that has a loop on the end (curette). ? Use a suction device.  Surgery to remove the wax buildup. This may be done in severe cases. Follow these instructions at home:   Take over-the-counter and prescription medicines only as told by your health care provider.  Do not put any objects, including cotton swabs, into your ear. You can clean the opening of your ear canal with a washcloth or facial tissue.  Follow instructions from your health care provider about cleaning your ears. Do not over-clean your ears.  Drink enough fluid to keep your urine clear or pale yellow. This will help to thin the earwax.  Keep all follow-up visits as told by your health care provider. If earwax builds up in your ears often or if you use hearing aids, consider seeing your health care provider for routine, preventive ear cleanings. Ask your health care provider how often you should schedule your cleanings.  If you have hearing aids, clean them according to instructions from the manufacturer and your health care provider. Contact a health care provider if:  You have ear pain.  You develop a fever.  You have blood, pus, or other fluid coming from your ear.  You have hearing loss.  You have ringing in your ears that does not go away.  Your symptoms do not improve with treatment.  You feel like the room is spinning (vertigo). Summary  Earwax  can build up in the ear and cause discomfort or hearing loss.  The most common symptoms of this condition include reduced or muffled hearing and a feeling of fullness in the ear or feeling that the ear is plugged.  This condition may be diagnosed based on your symptoms, your medical history, and an ear exam.  This condition may  be treated by using ear drops to soften the earwax or by having the earwax removed by a health care provider.  Do not put any objects, including cotton swabs, into your ear. You can clean the opening of your ear canal with a washcloth or facial tissue. This information is not intended to replace advice given to you by your health care provider. Make sure you discuss any questions you have with your health care provider. Document Released: 07/27/2004 Document Revised: 06/01/2017 Document Reviewed: 08/30/2016 Elsevier Patient Education  [employer].        If you have lab work done today you will be contacted with your lab results within the next 2 weeks.  If you have not heard from [place] then please contact us. The fastest way to get your results is to register for My Chart.   IF you received an x-ray today, you will receive an invoice from [facility]. Please contact [facility] at [phone] with questions or concerns regarding your invoice.   IF you received labwork today, you will receive an invoice from [facility]. Please contact [facility] at [phone] with questions or concerns regarding your invoice.   Our billing staff will not be able to assist you with questions regarding bills from these companies.  You will be contacted with the lab results as soon as they are available. The fastest way to get your results is to activate your My Chart account. Instructions are located on the last page of this paperwork. If you have not heard from [place] regarding the results in 2 weeks, please contact this office.       Signed,   Merri Ray, MD [facility].  03/27/19 8:48 AM

## 2019-03-28 LAB — COMPREHENSIVE METABOLIC PANEL
ALT: 24 IU/L (ref 0–44)
AST: 21 IU/L (ref 0–40)
Albumin/Globulin Ratio: 1.9 (ref 1.2–2.2)
Albumin: 4.3 g/dL (ref 3.7–4.7)
Alkaline Phosphatase: 86 IU/L (ref 39–117)
BUN/Creatinine Ratio: 15 (ref 10–24)
BUN: 14 mg/dL (ref 8–27)
Bilirubin Total: 0.8 mg/dL (ref 0.0–1.2)
CO2: 22 mmol/L (ref 20–29)
Calcium: 9.4 mg/dL (ref 8.6–10.2)
Chloride: 103 mmol/L (ref 96–106)
Creatinine, Ser: 0.95 mg/dL (ref 0.76–1.27)
GFR calc Af Amer: 91 mL/min/{1.73_m2} (ref >59)
GFR calc non Af Amer: 79 mL/min/{1.73_m2} (ref >59)
Globulin, Total: 2.3 g/dL (ref 1.5–4.5)
Glucose: 108 mg/dL — ABNORMAL HIGH (ref 65–99)
Potassium: 4.5 mmol/L (ref 3.5–5.2)
Sodium: 138 mmol/L (ref 134–144)
Total Protein: 6.6 g/dL (ref 6.0–8.5)

## 2019-03-28 LAB — LIPID PANEL
Chol/HDL Ratio: 2.9 ratio (ref 0.0–5.0)
Cholesterol, Total: 195 mg/dL (ref 100–199)
HDL: 67 mg/dL (ref >39)
LDL Chol Calc (NIH): 114 mg/dL — ABNORMAL HIGH (ref 0–99)
Triglycerides: 77 mg/dL (ref 0–149)
VLDL Cholesterol Cal: 14 mg/dL (ref 5–40)

## 2019-03-28 LAB — PSA: Prostate Specific Ag, Serum: 0.5 ng/mL (ref 0.0–4.0)

## 2019-04-07 DIAGNOSIS — L578 Other skin changes due to chronic exposure to nonionizing radiation: Secondary | ICD-10-CM | POA: Diagnosis not present

## 2019-05-01 DIAGNOSIS — R69 Illness, unspecified: Secondary | ICD-10-CM | POA: Diagnosis not present

## 2019-05-08 ENCOUNTER — Other Ambulatory Visit: Payer: Self-pay | Admitting: Gastroenterology

## 2019-05-15 ENCOUNTER — Other Ambulatory Visit: Payer: Self-pay | Admitting: Gastroenterology

## 2019-05-21 ENCOUNTER — Other Ambulatory Visit: Payer: Self-pay | Admitting: Family Medicine

## 2019-05-21 DIAGNOSIS — N529 Male erectile dysfunction, unspecified: Secondary | ICD-10-CM

## 2019-06-15 ENCOUNTER — Other Ambulatory Visit: Payer: Self-pay | Admitting: Family Medicine

## 2019-06-15 DIAGNOSIS — N529 Male erectile dysfunction, unspecified: Secondary | ICD-10-CM

## 2019-08-12 ENCOUNTER — Other Ambulatory Visit: Payer: Self-pay | Admitting: Gastroenterology

## 2019-08-15 ENCOUNTER — Ambulatory Visit: Payer: Medicare HMO

## 2019-08-25 ENCOUNTER — Encounter: Payer: Self-pay | Admitting: Gastroenterology

## 2019-08-25 ENCOUNTER — Ambulatory Visit (INDEPENDENT_AMBULATORY_CARE_PROVIDER_SITE_OTHER): Payer: Medicare HMO | Admitting: Gastroenterology

## 2019-08-25 VITALS — Ht 70.0 in | Wt 200.0 lb

## 2019-08-25 DIAGNOSIS — K2270 Barrett's esophagus without dysplasia: Secondary | ICD-10-CM

## 2019-08-25 DIAGNOSIS — Z86010 Personal history of colonic polyps: Secondary | ICD-10-CM

## 2019-08-25 MED ORDER — OMEPRAZOLE 20 MG PO CPDR: 20.0000 mg | DELAYED_RELEASE_CAPSULE | Freq: Every day | ORAL | 3 refills | Status: DC

## 2019-08-25 NOTE — Progress Notes (Signed)
    History of Present Illness: This is a 73 year old male with a history of short segment Barrett's without dysplasia and a personal history of adenomatous colon polyps.  He has no gastrointestinal complaints and relates his GERD has been well controlled on omeprazole 20 mg daily.  Both he and his wife have received both doses of Pfizer SARS-CoV-2 vaccine and by February 26 he will be 2 weeks out from his second dose.  Current Medications, Allergies, Past Medical History, Past Surgical History, Family History and Social History were reviewed in [employer] record.   Physical Exam: (Very limited with virtual visit) General: Well developed, well nourished, no acute distress Head: Normocephalic and atraumatic Eyes:  sclerae anicteric, EOMI Ears: Normal auditory acuity Psychological:  Alert and cooperative. Normal mood and affect   Assessment and Recommendations:  1.  Short segment Barrett's without dysplasia.  He is due soon for surveillance EGD and is not comfortable coming in yet due to the COVID-19 pandemic.  Continue omeprazole 20 mg daily.  Follow standard antireflux measures.  Change EGD recall to July 2021.  2.  Personal history of adenomatous colon polyps.  He is due for surveillance colonoscopy and as above is not comfortable coming in yet due to the COVID-19 pandemic.  Change colonoscopy recall to July 2021.   These services were provided via telemedicine, audio and video.  The patient was at his home and the provider was in the office, alone.  We discussed the limitations of evaluation and management by telemedicine and the availability of in person appointments.  Patient consented for this telemedicine visit and is aware of possible charges for this service.  Office CMA or LPN participated in this telemedicine service.  Time spent on call: 7 minutes

## 2019-08-25 NOTE — Patient Instructions (Signed)
We have sent the following medications to your pharmacy for you to pick up at your convenience: omeprazole.   You will be due for a recall colonoscopy/upper endoscopy in 01/2020. We will send you a reminder in the mail when it gets closer to that time.  Thank you for choosing me and [facility].  Pricilla Riffle. Dagoberto Ligas., MD., [facility]

## 2019-09-15 DIAGNOSIS — L570 Actinic keratosis: Secondary | ICD-10-CM | POA: Diagnosis not present

## 2019-09-15 DIAGNOSIS — L821 Other seborrheic keratosis: Secondary | ICD-10-CM | POA: Diagnosis not present

## 2019-09-19 DIAGNOSIS — H04123 Dry eye syndrome of bilateral lacrimal glands: Secondary | ICD-10-CM | POA: Diagnosis not present

## 2019-09-19 DIAGNOSIS — Z961 Presence of intraocular lens: Secondary | ICD-10-CM | POA: Diagnosis not present

## 2019-09-19 DIAGNOSIS — H401111 Primary open-angle glaucoma, right eye, mild stage: Secondary | ICD-10-CM | POA: Diagnosis not present

## 2019-09-19 DIAGNOSIS — H401122 Primary open-angle glaucoma, left eye, moderate stage: Secondary | ICD-10-CM | POA: Diagnosis not present

## 2020-01-26 DIAGNOSIS — R69 Illness, unspecified: Secondary | ICD-10-CM | POA: Diagnosis not present

## 2020-02-07 DIAGNOSIS — Z0100 Encounter for examination of eyes and vision without abnormal findings: Secondary | ICD-10-CM | POA: Diagnosis not present

## 2020-02-09 DIAGNOSIS — R69 Illness, unspecified: Secondary | ICD-10-CM | POA: Diagnosis not present

## 2020-03-02 ENCOUNTER — Telehealth: Payer: Self-pay | Admitting: Family Medicine

## 2020-03-02 NOTE — Telephone Encounter (Signed)
Pt needs to reschedule this CPE, last cpe 03/27/2019/awv  called pt LVM for him to call us to reschedule this physical as provider not available

## 2020-03-26 ENCOUNTER — Other Ambulatory Visit: Payer: Self-pay

## 2020-03-26 ENCOUNTER — Encounter: Payer: Self-pay | Admitting: Family Medicine

## 2020-03-26 ENCOUNTER — Other Ambulatory Visit: Payer: Self-pay | Admitting: Family Medicine

## 2020-03-26 ENCOUNTER — Ambulatory Visit (INDEPENDENT_AMBULATORY_CARE_PROVIDER_SITE_OTHER): Payer: Medicare HMO | Admitting: Family Medicine

## 2020-03-26 VITALS — BP 136/86 | HR 61 | Temp 97.5°F | Ht 70.0 in | Wt 191.0 lb

## 2020-03-26 DIAGNOSIS — Z0000 Encounter for general adult medical examination without abnormal findings: Secondary | ICD-10-CM | POA: Diagnosis not present

## 2020-03-26 DIAGNOSIS — Z0001 Encounter for general adult medical examination with abnormal findings: Secondary | ICD-10-CM

## 2020-03-26 DIAGNOSIS — J301 Allergic rhinitis due to pollen: Secondary | ICD-10-CM | POA: Diagnosis not present

## 2020-03-26 DIAGNOSIS — G479 Sleep disorder, unspecified: Secondary | ICD-10-CM

## 2020-03-26 DIAGNOSIS — K2270 Barrett's esophagus without dysplasia: Secondary | ICD-10-CM

## 2020-03-26 DIAGNOSIS — Z125 Encounter for screening for malignant neoplasm of prostate: Secondary | ICD-10-CM

## 2020-03-26 DIAGNOSIS — E785 Hyperlipidemia, unspecified: Secondary | ICD-10-CM

## 2020-03-26 DIAGNOSIS — Z23 Encounter for immunization: Secondary | ICD-10-CM | POA: Diagnosis not present

## 2020-03-26 DIAGNOSIS — N528 Other male erectile dysfunction: Secondary | ICD-10-CM

## 2020-03-26 MED ORDER — SILDENAFIL CITRATE 20 MG PO TABS: 20.0000 mg | ORAL_TABLET | Freq: Three times a day (TID) | ORAL | 5 refills | Status: DC

## 2020-03-26 MED ORDER — ATORVASTATIN CALCIUM 20 MG PO TABS: 20.0000 mg | ORAL_TABLET | Freq: Every day | ORAL | 3 refills | Status: DC

## 2020-03-26 MED ORDER — ZOLPIDEM TARTRATE 5 MG PO TABS: 5.0000 mg | ORAL_TABLET | Freq: Every evening | ORAL | 1 refills | Status: DC | PRN

## 2020-03-26 MED ORDER — OMEPRAZOLE 20 MG PO CPDR: 20.0000 mg | DELAYED_RELEASE_CAPSULE | Freq: Every day | ORAL | 3 refills | Status: DC

## 2020-03-26 MED ORDER — FLUTICASONE PROPIONATE 50 MCG/ACT NA SUSP: 1.0000 | Freq: Every day | NASAL | 1 refills | Status: DC

## 2020-03-26 NOTE — Progress Notes (Signed)
Patient ID: Stephen Conway, male    DOB: 1945-10-25  Age: 74 y.o. MRN: [id number]  Chief Complaint  Patient presents with  . annual wellness    labs , psa check also    Subjective:  Patient is here for his annual physical examination.  He has generally been healthy man.  No major acute complaints today.  Past medical history: Operations: Right meniscus Major illnesses: History of Barrett's esophagus treated by Dr. Fuller Plan. Diverticulosis of the colon Medications: Atorvastatin Fluorouracil cream Proesia for rosacia Glucosamine/chondroitin Xalatan Melatonin Omeprazole Occasional Sonata or Ambien, such as when he travels Allergies: None known  Reviewed through with him his history is documented in Dr. Vonna Kotyk progress note last year.  Social history: He is retired he has been married for 60 years he has 2 children alive and 3 grandchildren one 19-year-old was lost to motor vehicle accident many years ago.  He does not smoke he drinks 2 or more drinks on the weekend days.  Does not use drugs.  He golfs, usually walking 18 holes several days a week, plays tennis or pickle ball the other days.  Family history: Parents are deceased.  Father had a CVA.  Mother died at an old age of nonspecific causes.  Review of systems: Constitutional: Unremarkable HEENT: Unremarkable Cardiovascular: Unremarkable Respiratory: Unremarkable GI: Unremarkable GU: Unremarkable.  He does want a PSA test done because so many of his friends have problems. Musculoskeletal unremarkable Dermatologic: Unremarkable.  Does have the rosacea problems Neurologic: Unremarkable Psychiatric: Unremarkable   Current allergies, medications, problem list, past/family and social histories reviewed.  Objective:  BP 136/86   Pulse 61   Temp (!) 97.5 F (36.4 C)   Ht $R'5\' 10"'Xi$  (1.778 m)   Wt 191 lb (86.6 kg)   SpO2 98%   BMI 27.41 kg/m  Healthy alert gentleman in no acute distress.  TMs normal.  Eyes PERRL.  Throat  clear.  Neck supple without nodes.  Chest clear to auscultation.  Heart rate without murmurs.  Abdomen soft without mass or tenderness.  Normal male external genitalia with testes centered.  No hernias.  Extremities unremarkable.  Skin unremarkable.  Assessment & Plan:   Assessment: 1. Encounter for immunization   2. Encounter for annual wellness exam in Medicare patient   3. Screening for prostate cancer   4. Hyperlipidemia, unspecified hyperlipidemia type   5. Annual physical exam   6. Other male erectile dysfunction   7. Sleep disturbance   8. Seasonal allergic rhinitis due to pollen   9. Barrett's esophagus without dysplasia       Plan: See instructions.  Orders Placed This Encounter  Procedures  . Flu Vaccine QUAD High Dose(Fluad)  . Lipid panel  . CMP14+EGFR  . PSA    Meds ordered this encounter  Medications  . zolpidem (AMBIEN) 5 MG tablet    Sig: Take 1 tablet (5 mg total) by mouth at bedtime as needed for sleep.    Dispense:  15 tablet    Refill:  1  . atorvastatin (LIPITOR) 20 MG tablet    Sig: Take 1 tablet (20 mg total) by mouth daily.    Dispense:  90 tablet    Refill:  3  . fluticasone (FLONASE) 50 MCG/ACT nasal spray    Sig: Place 1 spray into both nostrils daily.    Dispense:  16 g    Refill:  1  . omeprazole (PRILOSEC) 20 MG capsule    Sig: Take 1 capsule (20 mg  total) by mouth daily.    Dispense:  90 capsule    Refill:  3    Please call for appointment  . sildenafil (REVATIO) 20 MG tablet    Sig: Take 1 tablet (20 mg total) by mouth 3 (three) times daily. Take 1-3 tablets (20-60 mg) prior to anticipated intercourse    Dispense:  20 tablet    Refill:  5         Patient Instructions     We will let you know the results of your labs  Be proactive in trying to be physically, emotionally, relationally, and spiritually healthy.  Continue your regular exercise  I do recommend the Covid booster when it becomes available for your  category.  Return if problems   If you have lab work done today you will be contacted with your lab results within the next 2 weeks.  If you have not heard from [place] then please contact us. The fastest way to get your results is to register for My Chart.   IF you received an x-ray today, you will receive an invoice from [facility]. Please contact [facility] at [phone] with questions or concerns regarding your invoice.   IF you received labwork today, you will receive an invoice from [facility]. Please contact [facility] at [phone] with questions or concerns regarding your invoice.   Our billing staff will not be able to assist you with questions regarding bills from these companies.  You will be contacted with the lab results as soon as they are available. The fastest way to get your results is to activate your My Chart account. Instructions are located on the last page of this paperwork. If you have not heard from [place] regarding the results in 2 weeks, please contact this office.          Return in about 1 year (around 03/26/2021).   Ruben Reason, MD 03/26/2020

## 2020-03-26 NOTE — Patient Instructions (Addendum)
   We will let you know the results of your labs  Be proactive in trying to be physically, emotionally, relationally, and spiritually healthy.  Continue your regular exercise  I do recommend the Covid booster when it becomes available for your category.  Return if problems   If you have lab work done today you will be contacted with your lab results within the next 2 weeks.  If you have not heard from [place] then please contact us. The fastest way to get your results is to register for My Chart.   IF you received an x-ray today, you will receive an invoice from [facility]. Please contact [facility] at [phone] with questions or concerns regarding your invoice.   IF you received labwork today, you will receive an invoice from [place]. Please contact [facility] at [phone] with questions or concerns regarding your invoice.   Our billing staff will not be able to assist you with questions regarding bills from these companies.  You will be contacted with the lab results as soon as they are available. The fastest way to get your results is to activate your My Chart account. Instructions are located on the last page of this paperwork. If you have not heard from [place] regarding the results in 2 weeks, please contact this office.

## 2020-03-27 LAB — CMP14+EGFR
ALT: 22 IU/L (ref 0–44)
AST: 24 IU/L (ref 0–40)
Albumin/Globulin Ratio: 2 (ref 1.2–2.2)
Albumin: 4.4 g/dL (ref 3.7–4.7)
Alkaline Phosphatase: 80 IU/L (ref 44–121)
BUN/Creatinine Ratio: 18 (ref 10–24)
BUN: 14 mg/dL (ref 8–27)
Bilirubin Total: 0.7 mg/dL (ref 0.0–1.2)
CO2: 23 mmol/L (ref 20–29)
Calcium: 9.4 mg/dL (ref 8.6–10.2)
Chloride: 100 mmol/L (ref 96–106)
Creatinine, Ser: 0.79 mg/dL (ref 0.76–1.27)
GFR calc Af Amer: 102 mL/min/{1.73_m2} (ref >59)
GFR calc non Af Amer: 88 mL/min/{1.73_m2} (ref >59)
Globulin, Total: 2.2 g/dL (ref 1.5–4.5)
Glucose: 115 mg/dL — ABNORMAL HIGH (ref 65–99)
Potassium: 4.7 mmol/L (ref 3.5–5.2)
Sodium: 139 mmol/L (ref 134–144)
Total Protein: 6.6 g/dL (ref 6.0–8.5)

## 2020-03-27 LAB — LIPID PANEL
Chol/HDL Ratio: 2.4 ratio (ref 0.0–5.0)
Cholesterol, Total: 185 mg/dL (ref 100–199)
HDL: 77 mg/dL (ref >39)
LDL Chol Calc (NIH): 99 mg/dL (ref 0–99)
Triglycerides: 47 mg/dL (ref 0–149)
VLDL Cholesterol Cal: 9 mg/dL (ref 5–40)

## 2020-03-27 LAB — PSA: Prostate Specific Ag, Serum: 0.5 ng/mL (ref 0.0–4.0)

## 2020-03-31 ENCOUNTER — Encounter: Payer: Medicare HMO | Admitting: Family Medicine

## 2020-04-12 DIAGNOSIS — L570 Actinic keratosis: Secondary | ICD-10-CM | POA: Diagnosis not present

## 2020-04-12 DIAGNOSIS — D229 Melanocytic nevi, unspecified: Secondary | ICD-10-CM | POA: Diagnosis not present

## 2020-04-12 DIAGNOSIS — L819 Disorder of pigmentation, unspecified: Secondary | ICD-10-CM | POA: Diagnosis not present

## 2020-04-12 DIAGNOSIS — D485 Neoplasm of uncertain behavior of skin: Secondary | ICD-10-CM | POA: Diagnosis not present

## 2020-04-12 DIAGNOSIS — L814 Other melanin hyperpigmentation: Secondary | ICD-10-CM | POA: Diagnosis not present

## 2020-04-12 DIAGNOSIS — L821 Other seborrheic keratosis: Secondary | ICD-10-CM | POA: Diagnosis not present

## 2020-04-17 DIAGNOSIS — L0390 Cellulitis, unspecified: Secondary | ICD-10-CM | POA: Diagnosis not present

## 2020-07-09 ENCOUNTER — Other Ambulatory Visit: Payer: Self-pay

## 2020-07-09 ENCOUNTER — Ambulatory Visit: Payer: Medicare HMO | Admitting: Podiatry

## 2020-07-09 ENCOUNTER — Encounter: Payer: Self-pay | Admitting: Podiatry

## 2020-07-09 DIAGNOSIS — L600 Ingrowing nail: Secondary | ICD-10-CM | POA: Diagnosis not present

## 2020-07-09 MED ORDER — NEOMYCIN-POLYMYXIN-HC 3.5-10000-1 OT SOLN: OTIC | 0 refills | Status: DC

## 2020-07-09 NOTE — Patient Instructions (Signed)

## 2020-07-12 DIAGNOSIS — Z0100 Encounter for examination of eyes and vision without abnormal findings: Secondary | ICD-10-CM | POA: Diagnosis not present

## 2020-07-12 DIAGNOSIS — H401122 Primary open-angle glaucoma, left eye, moderate stage: Secondary | ICD-10-CM | POA: Diagnosis not present

## 2020-07-12 DIAGNOSIS — H524 Presbyopia: Secondary | ICD-10-CM | POA: Diagnosis not present

## 2020-07-12 DIAGNOSIS — Z961 Presence of intraocular lens: Secondary | ICD-10-CM | POA: Diagnosis not present

## 2020-07-12 DIAGNOSIS — H04123 Dry eye syndrome of bilateral lacrimal glands: Secondary | ICD-10-CM | POA: Diagnosis not present

## 2020-07-12 DIAGNOSIS — H401111 Primary open-angle glaucoma, right eye, mild stage: Secondary | ICD-10-CM | POA: Diagnosis not present

## 2020-07-12 NOTE — Progress Notes (Signed)
Subjective:   Patient ID: Stephen Conway, male   DOB: 75 y.o.   MRN: [id number]   HPI Patient presents with painful ingrown toenail deformity left hallux that he is worked on trying to trim and soak himself and its not getting better   ROS      Objective:  Physical Exam  Neurovascular status intact with incurvated lateral border left hallux painful when pressed with no active drainage and mild redness noted     Assessment:  Chronic ingrown toenail deformity left hallux lateral border     Plan:  H&P reviewed condition and recommended correction.  I explained procedure risk and patient wants surgery and today I infiltrated the left hallux 60 mg like Marcaine mixture sterile prep done using sterile instrumentation remove the lateral border exposed matrix applied phenol 3 applications 30 seconds followed by alcohol lavage sterile dressing and gave instructions on soaks.  Instructed on leaving dressing on 24 hours particular up earlier if any throbbing were to occur and encouraged him to call questions concerns

## 2020-08-19 ENCOUNTER — Telehealth: Payer: Self-pay | Admitting: Podiatry

## 2020-08-19 NOTE — Telephone Encounter (Signed)
Patient believes his toe is infected following his removal on 2/7. He has appt on Monday but wanted to know if you could send in a prescription to the [facility] in [place].

## 2020-08-19 NOTE — Telephone Encounter (Signed)
Patient calling to inquire about possible Rx for antibiotic. Patient states their toe is showing signs of infection and would like an antibiotic to be prescribed prior to appointment.

## 2020-08-23 ENCOUNTER — Ambulatory Visit: Payer: Medicare HMO | Admitting: Podiatry

## 2020-08-23 ENCOUNTER — Telehealth: Payer: Self-pay

## 2020-08-23 ENCOUNTER — Other Ambulatory Visit: Payer: Self-pay | Admitting: Podiatry

## 2020-08-23 MED ORDER — DOXYCYCLINE HYCLATE 100 MG PO TABS: 100.0000 mg | ORAL_TABLET | Freq: Two times a day (BID) | ORAL | 0 refills | Status: DC

## 2020-08-23 NOTE — Telephone Encounter (Signed)
Pt has been updated.  

## 2020-08-23 NOTE — Telephone Encounter (Signed)
Called in antibiotic

## 2020-08-23 NOTE — Telephone Encounter (Signed)
Pt called stating that he has an infected toenail and would like an antibiotic. Please advise

## 2020-11-26 ENCOUNTER — Telehealth (INDEPENDENT_AMBULATORY_CARE_PROVIDER_SITE_OTHER): Payer: Medicare HMO | Admitting: Family Medicine

## 2020-11-26 ENCOUNTER — Encounter: Payer: Self-pay | Admitting: Family Medicine

## 2020-11-26 DIAGNOSIS — G479 Sleep disorder, unspecified: Secondary | ICD-10-CM

## 2020-11-26 DIAGNOSIS — U071 COVID-19: Secondary | ICD-10-CM | POA: Diagnosis not present

## 2020-11-26 MED ORDER — NIRMATRELVIR/RITONAVIR (PAXLOVID)TABLET: 3.0000 | ORAL_TABLET | Freq: Two times a day (BID) | ORAL | 0 refills | Status: AC

## 2020-11-26 MED ORDER — ZOLPIDEM TARTRATE 5 MG PO TABS: 5.0000 mg | ORAL_TABLET | Freq: Every evening | ORAL | 0 refills | Status: DC | PRN

## 2020-11-26 NOTE — Progress Notes (Signed)
I connected with  Stephen Conway on 11/26/20 by a video enabled telemedicine application and verified that I am speaking with the correct person using two identifiers.   I discussed the limitations of evaluation and management by telemedicine. The patient expressed understanding and agreed to proceed.

## 2020-11-26 NOTE — Progress Notes (Signed)
Virtual Visit via Video   I connected with patient on 11/26/20 at 10:00 AM EDT by a video enabled telemedicine application and verified that I am speaking with the correct person using two identifiers.  Location patient: Home Location provider: [facility], Office Persons participating in the virtual visit: Patient, Provider, [place] (Sabrina M)  I discussed the limitations of evaluation and management by telemedicine and the availability of in person appointments. The patient expressed understanding and agreed to proceed.  Subjective:   HPI:   COVID- sxs started Wednesday night w/ 'flu like sxs'.  + subjective fevers.  Took home test yesterday morning and it was +.  Recent sick contacts w/ COVID (grandson who lives w/ them).  No SOB.  + hacking cough.  + nasal congestion, sinus HA.  + body aches.  Drinking lots of water.  Pt is vaccinated and boosted.  ROS:   See pertinent positives and negatives per HPI.  Patient Active Problem List   Diagnosis Date Noted  . Rosacea 02/24/2016  . Hyperlipidemia 02/24/2016  . Erectile dysfunction 02/03/2013  . Lipids abnormal 09/25/2011  . Personal history of colonic polyps 09/26/2010  . Barrett's esophagus 09/26/2010    Social History   Tobacco Use  . Smoking status: Former Smoker    Types: Pipe  . Smokeless tobacco: Never Used  . Tobacco comment: pr stopped in 2020  Substance Use Topics  . Alcohol use: Yes    Alcohol/week: 14.0 standard drinks    Types: 14 Glasses of wine per week    Comment: couple glasses of wine nightly    Current Outpatient Medications:  .  atorvastatin (LIPITOR) 20 MG tablet, Take 1 tablet (20 mg total) by mouth daily., Disp: 90 tablet, Rfl: 3 .  Dermatological Products, Misc. (DERMAREST ROSACEA EX), Apply topically., Disp: , Rfl:  .  fluticasone (FLONASE) 50 MCG/ACT nasal spray, Place 1 spray into both nostrils daily., Disp: 16 g, Rfl: 1 .  Glucosamine-Chondroit-Vit C-Mn (GLUCOSAMINE 1500 COMPLEX PO),  Take by mouth every other day. , Disp: , Rfl:  .  latanoprost (XALATAN) 0.005 % ophthalmic solution, 1 drop at bedtime., Disp: , Rfl:  .  Melatonin 1 MG TABS, Take 1 tablet by mouth at bedtime., Disp: , Rfl:  .  omeprazole (PRILOSEC) 20 MG capsule, Take 1 capsule (20 mg total) by mouth daily., Disp: 90 capsule, Rfl: 3 .  sildenafil (REVATIO) 20 MG tablet, Take 1 tablet (20 mg total) by mouth 3 (three) times daily. Take 1-3 tablets (20-60 mg) prior to anticipated intercourse, Disp: 20 tablet, Rfl: 5 .  zolpidem (AMBIEN) 5 MG tablet, Take 1 tablet (5 mg total) by mouth at bedtime as needed for sleep., Disp: 15 tablet, Rfl: 1 .  doxycycline (VIBRA-TABS) 100 MG tablet, Take 1 tablet (100 mg total) by mouth 2 (two) times daily. (Patient not taking: Reported on 11/26/2020), Disp: 20 tablet, Rfl: 0 .  neomycin-polymyxin-hydrocortisone (CORTISPORIN) OTIC solution, Apply 2-3 drops to the ingrown toenail site twice daily. Cover with band-aid. (Patient not taking: Reported on 11/26/2020), Disp: 10 mL, Rfl: 0  No Known Allergies  Objective:   There were no vitals taken for this visit. AAOx3, NAD NCAT, EOMI No obvious CN deficits Coloring WNL Pt is able to speak clearly, coherently without shortness of breath or increased work of breathing.  + Hacking cough Thought process is linear.  Mood is appropriate.   Assessment and Plan:   COVID- new.  Pt has known exposure last week and tested positive  yesterday after developing sxs Wednesday night.  Given his medium risk of COVID complications, we will start Paxlovid.  GFR is more than adequate.  He was instructed to hold his Atorvastatin and Sildenafil during his tx course.  Encouraged fluids, rest, supportive OTC meds, and starting Vit D, Vit C, and Zinc.  Will enroll him in home monitoring program.  Pt expressed understanding and is in agreement w/ plan.   Sleep disorder- pt has upcoming international flight this summer and asking for refill of Zolpidem.   Prescription sent  Annye Asa, MD 11/26/2020

## 2020-12-08 ENCOUNTER — Telehealth: Payer: Self-pay | Admitting: Family Medicine

## 2020-12-08 MED ORDER — GUAIFENESIN-CODEINE 100-10 MG/5ML PO SYRP: 10.0000 mL | ORAL_SOLUTION | Freq: Three times a day (TID) | ORAL | 0 refills | Status: DC | PRN

## 2020-12-08 NOTE — Telephone Encounter (Signed)
Left a vm message informing the patient.

## 2020-12-08 NOTE — Telephone Encounter (Signed)
Prescription for codeine based cough syrup sent to pharmacy

## 2020-12-08 NOTE — Telephone Encounter (Signed)
Pt called in stating that he is still chest congestion and a congested cough. He wanted to know if something could be sent in for him pt had a mychart visit on 05/27 with Tabori.  Please advise   [facility] in [place]

## 2020-12-20 DIAGNOSIS — L821 Other seborrheic keratosis: Secondary | ICD-10-CM | POA: Diagnosis not present

## 2020-12-20 DIAGNOSIS — C44329 Squamous cell carcinoma of skin of other parts of face: Secondary | ICD-10-CM | POA: Diagnosis not present

## 2020-12-20 DIAGNOSIS — D229 Melanocytic nevi, unspecified: Secondary | ICD-10-CM | POA: Diagnosis not present

## 2020-12-20 DIAGNOSIS — L218 Other seborrheic dermatitis: Secondary | ICD-10-CM | POA: Diagnosis not present

## 2020-12-20 DIAGNOSIS — L570 Actinic keratosis: Secondary | ICD-10-CM | POA: Diagnosis not present

## 2020-12-20 DIAGNOSIS — D0439 Carcinoma in situ of skin of other parts of face: Secondary | ICD-10-CM | POA: Diagnosis not present

## 2020-12-20 DIAGNOSIS — L814 Other melanin hyperpigmentation: Secondary | ICD-10-CM | POA: Diagnosis not present

## 2020-12-20 DIAGNOSIS — D485 Neoplasm of uncertain behavior of skin: Secondary | ICD-10-CM | POA: Diagnosis not present

## 2020-12-20 DIAGNOSIS — L819 Disorder of pigmentation, unspecified: Secondary | ICD-10-CM | POA: Diagnosis not present

## 2020-12-22 DIAGNOSIS — L814 Other melanin hyperpigmentation: Secondary | ICD-10-CM | POA: Diagnosis not present

## 2020-12-22 DIAGNOSIS — D0439 Carcinoma in situ of skin of other parts of face: Secondary | ICD-10-CM | POA: Diagnosis not present

## 2020-12-29 DIAGNOSIS — D485 Neoplasm of uncertain behavior of skin: Secondary | ICD-10-CM | POA: Diagnosis not present

## 2020-12-29 DIAGNOSIS — L989 Disorder of the skin and subcutaneous tissue, unspecified: Secondary | ICD-10-CM | POA: Diagnosis not present

## 2021-01-06 DIAGNOSIS — D485 Neoplasm of uncertain behavior of skin: Secondary | ICD-10-CM | POA: Diagnosis not present

## 2021-01-06 DIAGNOSIS — L905 Scar conditions and fibrosis of skin: Secondary | ICD-10-CM | POA: Diagnosis not present

## 2021-01-06 DIAGNOSIS — L989 Disorder of the skin and subcutaneous tissue, unspecified: Secondary | ICD-10-CM | POA: Diagnosis not present

## 2021-01-06 DIAGNOSIS — D0439 Carcinoma in situ of skin of other parts of face: Secondary | ICD-10-CM | POA: Diagnosis not present

## 2021-03-10 DIAGNOSIS — L570 Actinic keratosis: Secondary | ICD-10-CM | POA: Diagnosis not present

## 2021-03-10 DIAGNOSIS — D0439 Carcinoma in situ of skin of other parts of face: Secondary | ICD-10-CM | POA: Diagnosis not present

## 2021-03-28 ENCOUNTER — Encounter: Payer: Self-pay | Admitting: Family Medicine

## 2021-03-28 ENCOUNTER — Ambulatory Visit (INDEPENDENT_AMBULATORY_CARE_PROVIDER_SITE_OTHER): Payer: Medicare HMO | Admitting: Family Medicine

## 2021-03-28 ENCOUNTER — Other Ambulatory Visit: Payer: Self-pay

## 2021-03-28 VITALS — BP 124/88 | HR 66 | Temp 98.2°F | Resp 18 | Ht 69.0 in | Wt 196.6 lb

## 2021-03-28 DIAGNOSIS — Z125 Encounter for screening for malignant neoplasm of prostate: Secondary | ICD-10-CM

## 2021-03-28 DIAGNOSIS — Z23 Encounter for immunization: Secondary | ICD-10-CM | POA: Diagnosis not present

## 2021-03-28 DIAGNOSIS — K2270 Barrett's esophagus without dysplasia: Secondary | ICD-10-CM | POA: Diagnosis not present

## 2021-03-28 DIAGNOSIS — R030 Elevated blood-pressure reading, without diagnosis of hypertension: Secondary | ICD-10-CM | POA: Diagnosis not present

## 2021-03-28 DIAGNOSIS — Z130 Encounter for screening for diseases of the blood and blood-forming organs and certain disorders involving the immune mechanism: Secondary | ICD-10-CM | POA: Diagnosis not present

## 2021-03-28 DIAGNOSIS — N528 Other male erectile dysfunction: Secondary | ICD-10-CM

## 2021-03-28 DIAGNOSIS — E785 Hyperlipidemia, unspecified: Secondary | ICD-10-CM

## 2021-03-28 DIAGNOSIS — Z1329 Encounter for screening for other suspected endocrine disorder: Secondary | ICD-10-CM | POA: Diagnosis not present

## 2021-03-28 DIAGNOSIS — Z0000 Encounter for general adult medical examination without abnormal findings: Secondary | ICD-10-CM

## 2021-03-28 DIAGNOSIS — G479 Sleep disorder, unspecified: Secondary | ICD-10-CM

## 2021-03-28 LAB — COMPREHENSIVE METABOLIC PANEL
ALT: 20 U/L (ref 0–53)
AST: 20 U/L (ref 0–37)
Albumin: 4.1 g/dL (ref 3.5–5.2)
Alkaline Phosphatase: 66 U/L (ref 39–117)
BUN: 15 mg/dL (ref 6–23)
CO2: 28 mEq/L (ref 19–32)
Calcium: 9.3 mg/dL (ref 8.4–10.5)
Chloride: 100 mEq/L (ref 96–112)
Creatinine, Ser: 0.76 mg/dL (ref 0.40–1.50)
GFR: 87.48 mL/min (ref >60.00)
Glucose, Bld: 93 mg/dL (ref 70–99)
Potassium: 4.2 mEq/L (ref 3.5–5.1)
Sodium: 136 mEq/L (ref 135–145)
Total Bilirubin: 0.8 mg/dL (ref 0.2–1.2)
Total Protein: 6.5 g/dL (ref 6.0–8.3)

## 2021-03-28 LAB — LIPID PANEL
Cholesterol: 186 mg/dL (ref 0–200)
HDL: 77.1 mg/dL (ref >39.00)
LDL Cholesterol: 97 mg/dL (ref 0–99)
NonHDL: 109.28
Total CHOL/HDL Ratio: 2
Triglycerides: 63 mg/dL (ref 0.0–149.0)
VLDL: 12.6 mg/dL (ref 0.0–40.0)

## 2021-03-28 LAB — CBC
HCT: 43.2 % (ref 39.0–52.0)
Hemoglobin: 14.5 g/dL (ref 13.0–17.0)
MCHC: 33.5 g/dL (ref 30.0–36.0)
MCV: 93.4 fl (ref 78.0–100.0)
Platelets: 200 10*3/uL (ref 150.0–400.0)
RBC: 4.62 Mil/uL (ref 4.22–5.81)
RDW: 12.7 % (ref 11.5–15.5)
WBC: 5.4 10*3/uL (ref 4.0–10.5)

## 2021-03-28 LAB — PSA: PSA: 0.46 ng/mL (ref 0.10–4.00)

## 2021-03-28 LAB — TSH: TSH: 1.61 u[IU]/mL (ref 0.35–5.50)

## 2021-03-28 MED ORDER — ZOLPIDEM TARTRATE 5 MG PO TABS: 5.0000 mg | ORAL_TABLET | Freq: Every evening | ORAL | 0 refills | Status: DC | PRN

## 2021-03-28 MED ORDER — ATORVASTATIN CALCIUM 20 MG PO TABS: 20.0000 mg | ORAL_TABLET | Freq: Every day | ORAL | 3 refills | Status: DC

## 2021-03-28 MED ORDER — SILDENAFIL CITRATE 20 MG PO TABS: 20.0000 mg | ORAL_TABLET | Freq: Every day | ORAL | 5 refills | Status: DC | PRN

## 2021-03-28 MED ORDER — OMEPRAZOLE 20 MG PO CPDR: 20.0000 mg | DELAYED_RELEASE_CAPSULE | Freq: Every day | ORAL | 3 refills | Status: DC

## 2021-03-28 NOTE — Progress Notes (Signed)
Subjective:  Patient ID: Stephen Conway, male    DOB: April 21, 1946  Age: 75 y.o. MRN: [id number]  CC:  Chief Complaint  Patient presents with   Annual Exam    HPI Stephen Conway presents for  Presents for annual wellness exam.   Care team: PCP: me Dermatology, Dr. Pearline Cables, Dr. Winifred Olive. Using Efudex for squamous cell, prior MOHS.  Podiatry, Dr. Paulla Dolly G[facility], Dr. Fuller Plan Ophthalmology, Dr. Laren Everts to [place] for 18 days 2 months ago. [place], [place].   Elevated blood pressure.   Prior borderline elevated blood pressure reading, discussed cutting back on alcohol at his September 2020 visit. Home readings 125-130/80-85. 2-3 glasses wine per night. Has cut back some since 2020.   Hyperlipidemia: Lipitor 20 mg daily. No new myalgias/side effects.   Lab Results  Component Value Date   CHOL 185 03/26/2020   HDL 77 03/26/2020   LDLCALC 99 03/26/2020   TRIG 47 03/26/2020   CHOLHDL 2.4 03/26/2020   Lab Results  Component Value Date   ALT 22 03/26/2020   AST 24 03/26/2020   ALKPHOS 80 03/26/2020   BILITOT 0.7 03/26/2020   Erectile dysfunction Treated with low-dose sildenafil previously, no vision/hearing changes, no chest pain or dyspnea with exertion.  No new side effects.  Has been effective when taking $RemoveBef'60mg'vcSpEekaiR$ .   Insomnia Treated with melatonin, rare use of Ambien previously.  Last filled 11/26/2020, 5 mg.  Previously in December 2021. Ambien 1/2 pill on 4-5 nights per month.  Less melatonin.  Fall screening Fall Risk  03/28/2021 11/26/2020 03/26/2020 03/27/2019 12/16/2018  Falls in the past year? 0 0 0 1 0  Number falls in past yr: 0 0 0 0 0  Injury with Fall? 0 0 0 0 0  Risk for fall due to : - No Fall Risks - - -  Follow up - - Falls evaluation completed Falls evaluation completed -   Lighting in home: adequate Loose rugs/carpets/pets: none Stairs:has flight up, not using but handrail in place Grab bars in bathroom: in shower Timed up and go: 8  seconds, normal gait, no instability.   Depression Screening: Depression screen [facility] 2/9 03/28/2021 11/26/2020 03/26/2020 03/27/2019 12/16/2018  Decreased Interest 0 0 0 0 0  Down, Depressed, Hopeless 0 0 0 0 0  PHQ - 2 Score 0 0 0 0 0  Altered sleeping 0 0 1 - -  Tired, decreased energy 0 0 0 - -  Change in appetite 0 0 0 - -  Feeling bad or failure about yourself  0 0 0 - -  Trouble concentrating 0 0 0 - -  Moving slowly or fidgety/restless 0 0 0 - -  Suicidal thoughts 0 0 0 - -  PHQ-9 Score 0 0 1 - -  Difficult doing work/chores Not difficult at all Not difficult at all Not difficult at all - -    Cancer Screening: Colonoscopy January 15, 2014 2 small polyps with internal hemorrhoids.  5-year follow-up recommended, due for repeat. Will call his GI. Also followed with endoscopy with hx of Barrets esophagus. PPI daily.  The natural history of prostate cancer and ongoing controversy regarding screening and potential treatment outcomes of prostate cancer has been discussed with the patient. The meaning of a false positive PSA and a false negative PSA has been discussed. He indicates understanding of the limitations of this screening test and wishes to proceed with screening PSA testing. Requests DRE today.   Lab Results  Component  Value Date   PSA1 0.5 03/26/2020   PSA1 0.5 03/27/2019   PSA1 0.6 03/07/2018   PSA 0.5 02/24/2016   PSA 0.59 02/08/2015   PSA 0.50 01/19/2014     Immunization History  Administered Date(s) Administered   Fluad Quad(high Dose 65+) 03/27/2019, 03/26/2020   Hepatitis A 02/24/1997, 02/18/2004   Hepatitis A, Adult 07/28/2004   Influenza, High Dose Seasonal PF 03/07/2018   Influenza,inj,Quad PF,6+ Mos 02/24/2016, 03/01/2017   Influenza-Unspecified 03/28/2014   MMR 02/18/2004   PFIZER(Purple Top)SARS-COV-2 Vaccination 07/24/2019, 08/14/2019   Pneumococcal Conjugate-13 01/19/2014   Pneumococcal Polysaccharide-23 08/10/2010   Td 07/03/2002   Tdap 01/19/2014    Zoster Recombinat (Shingrix) 03/28/2019  Flu vaccine today.  Covid bivalent booster recommended. Covid infection in May.   Functional Status Survey: Is the patient deaf or have difficulty hearing?: No Does the patient have difficulty seeing, even when wearing glasses/contacts?: No Does the patient have difficulty concentrating, remembering, or making decisions?: No Does the patient have difficulty walking or climbing stairs?: No Does the patient have difficulty dressing or bathing?: No Does the patient have difficulty doing errands alone such as visiting a doctor's office or shopping?: No  Memory Screen: 6CIT Screen 03/28/2021 03/26/2020 03/27/2019 03/07/2018  What Year? 0 points 0 points 0 points 0 points  What month? 0 points 0 points 0 points 0 points  What time? 0 points 0 points 0 points 0 points  Count back from 20 0 points 0 points 0 points 0 points  Months in reverse 0 points 0 points 0 points 0 points  Repeat phrase 0 points 0 points 0 points 0 points  Total Score 0 0 0 0    Alcohol Screening: [place] Office Visit from 03/28/2021 in [address]  AUDIT-C Score 6       Tobacco: none  No results found. Optho/optometry: every 6 months  Dental: Every 9 months.   Exercise: Walking 6.5 mi for golf 3 times per week.   Advanced Directives:  Has HCPOA and living will.      History Patient Active Problem List   Diagnosis Date Noted   Rosacea 02/24/2016   Hyperlipidemia 02/24/2016   Erectile dysfunction 02/03/2013   Lipids abnormal 09/25/2011   Personal history of colonic polyps 09/26/2010   Barrett's esophagus 09/26/2010   Past Medical History:  Diagnosis Date   Adenomatous polyp    Allergy    Barrett's esophagus 08/2006   Cataract    GERD (gastroesophageal reflux disease)    Glaucoma    Hyperlipidemia    Internal hemorrhoids 08/2005   Past Surgical History:  Procedure Laterality Date   CATARACT EXTRACTION      COLONOSCOPY     EYE SURGERY     HEMORRHOID SURGERY     KNEE ARTHROSCOPY     UPPER GASTROINTESTINAL ENDOSCOPY     VASECTOMY     No Known Allergies Prior to Admission medications   Medication Sig Start Date End Date Taking? Authorizing Provider  atorvastatin (LIPITOR) 20 MG tablet Take 1 tablet (20 mg total) by mouth daily. 03/26/20   Posey Boyer, MD  Dermatological Products, Misc. (DERMAREST ROSACEA EX) Apply topically.    [provider]  fluticasone (FLONASE) 50 MCG/ACT nasal spray Place 1 spray into both nostrils daily. 03/26/20   Posey Boyer, MD  Glucosamine-Chondroit-Vit C-Mn (GLUCOSAMINE 1500 COMPLEX PO) Take by mouth every other day.     [provider]  guaiFENesin-codeine (ROBITUSSIN AC) 100-10 MG/5ML syrup Take  10 mLs by mouth 3 (three) times daily as needed for cough. 12/08/20   Midge Minium, MD  latanoprost (XALATAN) 0.005 % ophthalmic solution 1 drop at bedtime.    [provider]  Melatonin 1 MG TABS Take 1 tablet by mouth at bedtime.    [provider]  omeprazole (PRILOSEC) 20 MG capsule Take 1 capsule (20 mg total) by mouth daily. 03/26/20   Posey Boyer, MD  sildenafil (REVATIO) 20 MG tablet Take 1 tablet (20 mg total) by mouth 3 (three) times daily. Take 1-3 tablets (20-60 mg) prior to anticipated intercourse 03/26/20   Posey Boyer, MD  zolpidem (AMBIEN) 5 MG tablet Take 1 tablet (5 mg total) by mouth at bedtime as needed for sleep. 11/26/20   Midge Minium, MD  zaleplon (SONATA) 10 MG capsule 10 mg. One tablet by mouth once a week   09/25/11  [provider]   Social History   Socioeconomic History   Marital status: Married    Spouse name: Not on file   Number of children: 2   Years of education: Not on file   Highest education level: Not on file  Occupational History   Occupation: Retired  Tobacco Use   Smoking status: Former    Types: Pipe   Smokeless tobacco: Never   Tobacco comments:    pr  stopped in 2020  Vaping Use   Vaping Use: Never used  Substance and Sexual Activity   Alcohol use: Yes    Alcohol/week: 14.0 standard drinks    Types: 14 Glasses of wine per week    Comment: couple glasses of wine nightly   Drug use: No   Sexual activity: Not on file  Other Topics Concern   Not on file  Social History Narrative   Not on file   Social Determinants of Health   Financial Resource Strain: Not on file  Food Insecurity: Not on file  Transportation Needs: Not on file  Physical Activity: Not on file  Stress: Not on file  Social Connections: Not on file  Intimate Partner Violence: Not on file    Review of Systems 13 point review of systems per patient health survey noted.  Negative other than as indicated above or in HPI.    Objective:   Vitals:   03/28/21 0803 03/28/21 1028  BP: (!) 160/90 124/88  Pulse: 66   Resp: 18   Temp: 98.2 F (36.8 C)   TempSrc: Temporal   SpO2: 98%   Weight: 196 lb 9.6 oz (89.2 kg)   Height: $Remove'5\' 9"'FaLCMxK$  (1.753 m)      Physical Exam Vitals reviewed.  Constitutional:      Appearance: He is well-developed.  HENT:     Head: Normocephalic and atraumatic.     Right Ear: External ear normal.     Left Ear: External ear normal.  Eyes:     Conjunctiva/sclera: Conjunctivae normal.     Pupils: Pupils are equal, round, and reactive to light.  Neck:     Thyroid: No thyromegaly.  Cardiovascular:     Rate and Rhythm: Normal rate and regular rhythm.     Heart sounds: Normal heart sounds.  Pulmonary:     Effort: Pulmonary effort is normal. No respiratory distress.     Breath sounds: Normal breath sounds. No wheezing.  Abdominal:     General: There is no distension.     Palpations: Abdomen is soft.     Tenderness: There is no  abdominal tenderness.  Genitourinary:    Prostate: Normal.  Musculoskeletal:        General: No tenderness. Normal range of motion.     Cervical back: Normal range of motion and neck supple.  Lymphadenopathy:      Cervical: No cervical adenopathy.  Skin:    General: Skin is warm and dry.  Neurological:     Mental Status: He is alert and oriented to person, place, and time.     Deep Tendon Reflexes: Reflexes are normal and symmetric.  Psychiatric:        Behavior: Behavior normal.     Assessment & Plan:  IVIE MAESE is a 75 y.o. male . Medicare annual wellness visit, subsequent  - .- anticipatory guidance as below in AVS, screening labs if needed. Health maintenance items as above in HPI discussed/recommended as applicable.  - no concerning responses on depression, fall, or functional status screening. Any positive responses noted as above. Advanced directives discussed as in CHL.   Other male erectile dysfunction - Plan: sildenafil (REVATIO) 20 MG tablet  - sildenafil Rx given - use lowest effective dose. Side effects discussed (including but not limited to headache/flushing, blue discoloration of vision, possible vascular steal and risk of cardiac effects if underlying unknown coronary artery disease, and permanent sensorineural hearing loss). Understanding expressed.  Hyperlipidemia, unspecified hyperlipidemia type - Plan: atorvastatin (LIPITOR) 20 MG tablet, Comprehensive metabolic panel, Lipid panel   -Stable, tolerating current regimen. Medications refilled. Labs pending as above.   Sleep disturbance - Plan: zolpidem (AMBIEN) 5 MG tablet  - stable with intermittent ambien use.   Barrett's esophagus without dysplasia - Plan: omeprazole (PRILOSEC) 20 MG capsule  - Stable, tolerating current regimen. Medications refilled.   Elevated blood pressure reading - Plan: Comprehensive metabolic panel, TSH, CBC  - improved on recheck. Check labs, decreased alcohol discussed as well.   Screening, anemia, deficiency, iron  Screening for thyroid disorder - check TSh with elevated BP.   Screening for prostate cancer - Plan: PSA - limitation and risk of false positives discussed as  above.  Needs flu shot - Plan: Flu Vaccine QUAD High Dose(Fluad)   Meds ordered this encounter  Medications   sildenafil (REVATIO) 20 MG tablet    Sig: Take 1-3 tablets (20-60 mg total) by mouth daily as needed. Take 1-3 tablets (20-60 mg) prior to anticipated intercourse    Dispense:  20 tablet    Refill:  5   atorvastatin (LIPITOR) 20 MG tablet    Sig: Take 1 tablet (20 mg total) by mouth daily.    Dispense:  90 tablet    Refill:  3   zolpidem (AMBIEN) 5 MG tablet    Sig: Take 1 tablet (5 mg total) by mouth at bedtime as needed for sleep.    Dispense:  15 tablet    Refill:  0   omeprazole (PRILOSEC) 20 MG capsule    Sig: Take 1 capsule (20 mg total) by mouth daily.    Dispense:  90 capsule    Refill:  3    Please call for appointment    Patient Instructions  Call gastroenterology for follow up appointment and likely colonoscopy.  Make sure you had 2nd part of shingrix vaccine and I do recommend the bivalent covid booster.  [place] is now offering the bivalent Covid booster vaccine. To schedule an appointment or to find more information, visit [email or web address] You can also call [phone], Monday through Friday, 7 a.m. to 7 p.m.  Keep an eye on your blood pressure. If remaining over 130/80 - follow up and we can discuss further.  Thanks for coming in today.   How to Take Your Blood Pressure Blood pressure is a measurement of how strongly your blood is pressing against the walls of your arteries. Arteries are blood vessels that carry blood from your heart throughout your body. Your health care provider takes your blood pressure at each office visit. You can also take your own blood pressure at home with a blood pressure monitor. You may need to take your own blood pressure to: Confirm a diagnosis of high blood pressure (hypertension). Monitor your blood pressure over time. Make sure your blood pressure medicine is working. Supplies needed: Blood pressure  monitor. Dining room chair to sit in. Table or desk. Small notebook and pencil or pen. How to prepare To get the most accurate reading, avoid the following for 30 minutes before you check your blood pressure: Drinking caffeine. Drinking alcohol. Eating. Smoking. Exercising. Five minutes before you check your blood pressure: Use the bathroom and urinate so that you have an empty bladder. Sit quietly in a dining room chair. Do not sit in a soft couch or an armchair. Do not talk. How to take your blood pressure To check your blood pressure, follow the instructions in the manual that came with your blood pressure monitor. If you have a digital blood pressure monitor, the instructions may be as follows: Sit up straight in a chair. Place your feet on the floor. Do not cross your ankles or legs. Rest your left arm at the level of your heart on a table or desk or on the arm of a chair. Pull up your shirt sleeve. Wrap the blood pressure cuff around the upper part of your left arm, 1 inch (2.5 cm) above your elbow. It is best to wrap the cuff around bare skin. Fit the cuff snugly around your arm. You should be able to place only one finger between the cuff and your arm. Position the cord so that it rests in the bend of your elbow. Press the power button. Sit quietly while the cuff inflates and deflates. Read the digital reading on the monitor screen and write the numbers down (record them) in a notebook. Wait 2-3 minutes, then repeat the steps, starting at step 1. What does my blood pressure reading mean? A blood pressure reading consists of a higher number over a lower number. Ideally, your blood pressure should be below 120/80. The first ("top") number is called the systolic pressure. It is a measure of the pressure in your arteries as your heart beats. The second ("bottom") number is called the diastolic pressure. It is a measure of the pressure in your arteries as the heart relaxes. Blood  pressure is classified into five stages. The following are the stages for adults who do not have a short-term serious illness or a chronic condition. Systolic pressure and diastolic pressure are measured in a unit called mm Hg (millimeters of mercury).  Normal Systolic pressure: below 588. Diastolic pressure: below 80. Elevated Systolic pressure: 502-774. Diastolic pressure: below 80. Hypertension stage 1 Systolic pressure: 128-786. Diastolic pressure: 76-72. Hypertension stage 2 Systolic pressure: 094 or above. Diastolic pressure: 90 or above. You can have elevated blood pressure or hypertension even if only the systolic or only the diastolic number in your reading is higher than normal. Follow these instructions at home: Medicines Take over-the-counter and prescription medicines only as told by your  health care provider. Tell your health care provider if you are having any side effects from blood pressure medicine. General instructions Check your blood pressure as often as recommended by your health care provider. Check your blood pressure at the same time every day. Take your monitor to the next appointment with your health care provider to make sure that: You are using it correctly. It provides accurate readings. Understand what your goal blood pressure numbers are. Keep all follow-up visits as told by your health care provider. This is important. General tips Your health care provider can suggest a reliable monitor that will meet your needs. There are several types of home blood pressure monitors. Choose a monitor that has an arm cuff. Do not choose a monitor that measures your blood pressure from your wrist or finger. Choose a cuff that wraps snugly around your upper arm. You should be able to fit only one finger between your arm and the cuff. You can buy a blood pressure monitor at most drugstores or online. Where to find more information American Heart Association:  www.heart.org Contact a health care provider if: Your blood pressure is consistently high. Your blood pressure is suddenly low. Get help right away if: Your systolic blood pressure is higher than 180. Your diastolic blood pressure is higher than 120. Summary Blood pressure is a measurement of how strongly your blood is pressing against the walls of your arteries. A blood pressure reading consists of a higher number over a lower number. Ideally, your blood pressure should be below 120/80. Check your blood pressure at the same time every day. Avoid caffeine, alcohol, smoking, and exercise for 30 minutes prior to checking your blood pressure. These agents can affect the accuracy of the blood pressure reading. This information is not intended to replace advice given to you by your health care provider. Make sure you discuss any questions you have with your health care provider. Document Revised: 04/28/2020 Document Reviewed: 06/13/2019 Elsevier Patient Education  2022 [place],   Merri Ray, MD [facility] 03/28/21 8:56 AM

## 2021-03-28 NOTE — Patient Instructions (Addendum)
Call gastroenterology for follow up appointment and likely colonoscopy.  Make sure you had 2nd part of shingrix vaccine and I do recommend the bivalent covid booster.  [facility] is now offering the bivalent Covid booster vaccine. To schedule an appointment or to find more information, visit [email or web address] You can also call [phone], Monday through Friday, 7 a.m. to 7 p.m.  Keep an eye on your blood pressure. If remaining over 130/80 - follow up and we can discuss further.  Thanks for coming in today.   How to Take Your Blood Pressure Blood pressure is a measurement of how strongly your blood is pressing against the walls of your arteries. Arteries are blood vessels that carry blood from your heart throughout your body. Your health care provider takes your blood pressure at each office visit. You can also take your own blood pressure at home with a blood pressure monitor. You may need to take your own blood pressure to: Confirm a diagnosis of high blood pressure (hypertension). Monitor your blood pressure over time. Make sure your blood pressure medicine is working. Supplies needed: Blood pressure monitor. Dining room chair to sit in. Table or desk. Small notebook and pencil or pen. How to prepare To get the most accurate reading, avoid the following for 30 minutes before you check your blood pressure: Drinking caffeine. Drinking alcohol. Eating. Smoking. Exercising. Five minutes before you check your blood pressure: Use the bathroom and urinate so that you have an empty bladder. Sit quietly in a dining room chair. Do not sit in a soft couch or an armchair. Do not talk. How to take your blood pressure To check your blood pressure, follow the instructions in the manual that came with your blood pressure monitor. If you have a digital blood pressure monitor, the instructions may be as follows: Sit up straight in a chair. Place your feet on the floor. Do not cross your  ankles or legs. Rest your left arm at the level of your heart on a table or desk or on the arm of a chair. Pull up your shirt sleeve. Wrap the blood pressure cuff around the upper part of your left arm, 1 inch (2.5 cm) above your elbow. It is best to wrap the cuff around bare skin. Fit the cuff snugly around your arm. You should be able to place only one finger between the cuff and your arm. Position the cord so that it rests in the bend of your elbow. Press the power button. Sit quietly while the cuff inflates and deflates. Read the digital reading on the monitor screen and write the numbers down (record them) in a notebook. Wait 2-3 minutes, then repeat the steps, starting at step 1. What does my blood pressure reading mean? A blood pressure reading consists of a higher number over a lower number. Ideally, your blood pressure should be below 120/80. The first ("top") number is called the systolic pressure. It is a measure of the pressure in your arteries as your heart beats. The second ("bottom") number is called the diastolic pressure. It is a measure of the pressure in your arteries as the heart relaxes. Blood pressure is classified into five stages. The following are the stages for adults who do not have a short-term serious illness or a chronic condition. Systolic pressure and diastolic pressure are measured in a unit called mm Hg (millimeters of mercury).  Normal Systolic pressure: below 983. Diastolic pressure: below 80. Elevated Systolic pressure: 382-505. Diastolic pressure: below 80. Hypertension  stage 1 Systolic pressure: 981-191. Diastolic pressure: 47-82. Hypertension stage 2 Systolic pressure: 956 or above. Diastolic pressure: 90 or above. You can have elevated blood pressure or hypertension even if only the systolic or only the diastolic number in your reading is higher than normal. Follow these instructions at home: Medicines Take over-the-counter and prescription  medicines only as told by your health care provider. Tell your health care provider if you are having any side effects from blood pressure medicine. General instructions Check your blood pressure as often as recommended by your health care provider. Check your blood pressure at the same time every day. Take your monitor to the next appointment with your health care provider to make sure that: You are using it correctly. It provides accurate readings. Understand what your goal blood pressure numbers are. Keep all follow-up visits as told by your health care provider. This is important. General tips Your health care provider can suggest a reliable monitor that will meet your needs. There are several types of home blood pressure monitors. Choose a monitor that has an arm cuff. Do not choose a monitor that measures your blood pressure from your wrist or finger. Choose a cuff that wraps snugly around your upper arm. You should be able to fit only one finger between your arm and the cuff. You can buy a blood pressure monitor at most drugstores or online. Where to find more information American Heart Association: www.heart.org Contact a health care provider if: Your blood pressure is consistently high. Your blood pressure is suddenly low. Get help right away if: Your systolic blood pressure is higher than 180. Your diastolic blood pressure is higher than 120. Summary Blood pressure is a measurement of how strongly your blood is pressing against the walls of your arteries. A blood pressure reading consists of a higher number over a lower number. Ideally, your blood pressure should be below 120/80. Check your blood pressure at the same time every day. Avoid caffeine, alcohol, smoking, and exercise for 30 minutes prior to checking your blood pressure. These agents can affect the accuracy of the blood pressure reading. This information is not intended to replace advice given to you by your health  care provider. Make sure you discuss any questions you have with your health care provider. Document Revised: 04/28/2020 Document Reviewed: 06/13/2019 Elsevier Patient Education  2022 [employer].

## 2021-05-11 DIAGNOSIS — Z872 Personal history of diseases of the skin and subcutaneous tissue: Secondary | ICD-10-CM | POA: Diagnosis not present

## 2021-05-11 DIAGNOSIS — L578 Other skin changes due to chronic exposure to nonionizing radiation: Secondary | ICD-10-CM | POA: Diagnosis not present

## 2021-05-11 DIAGNOSIS — Z09 Encounter for follow-up examination after completed treatment for conditions other than malignant neoplasm: Secondary | ICD-10-CM | POA: Diagnosis not present

## 2021-07-18 DIAGNOSIS — H04123 Dry eye syndrome of bilateral lacrimal glands: Secondary | ICD-10-CM | POA: Diagnosis not present

## 2021-07-18 DIAGNOSIS — H401122 Primary open-angle glaucoma, left eye, moderate stage: Secondary | ICD-10-CM | POA: Diagnosis not present

## 2021-07-18 DIAGNOSIS — H524 Presbyopia: Secondary | ICD-10-CM | POA: Diagnosis not present

## 2021-07-18 DIAGNOSIS — H401111 Primary open-angle glaucoma, right eye, mild stage: Secondary | ICD-10-CM | POA: Diagnosis not present

## 2021-07-18 DIAGNOSIS — Z961 Presence of intraocular lens: Secondary | ICD-10-CM | POA: Diagnosis not present

## 2021-07-18 LAB — HM DIABETES EYE EXAM

## 2021-07-19 ENCOUNTER — Encounter: Payer: Self-pay | Admitting: Family Medicine

## 2021-10-07 ENCOUNTER — Other Ambulatory Visit: Payer: Self-pay | Admitting: Family Medicine

## 2021-10-07 DIAGNOSIS — G479 Sleep disorder, unspecified: Secondary | ICD-10-CM

## 2021-10-07 NOTE — Telephone Encounter (Signed)
Patient is requesting a refill of the following medications: ?Requested Prescriptions  ? ?Pending Prescriptions Disp Refills  ? zolpidem (AMBIEN) 5 MG tablet [Pharmacy Med Name: ZOLPIDEM TARTRATE 5 MG TABLET] 15 tablet   ?  Sig: TAKE ONE TABLET BY MOUTH EVERY NIGHT AT BEDTIME AS NEEDED FOR SLEEP  ? ? ?Date of patient request: 10/07/21 ?Last office visit: 07/18/21 ?Date of last refill: 03/28/22 ?Last refill amount: 15 ? ?

## 2021-10-07 NOTE — Telephone Encounter (Signed)
Controlled substance database (PDMP) reviewed. No concerns appreciated.  ?Last filled 03/2021. Refill ordered. ?

## 2021-11-21 ENCOUNTER — Encounter: Payer: Self-pay | Admitting: Family Medicine

## 2021-11-21 ENCOUNTER — Ambulatory Visit (INDEPENDENT_AMBULATORY_CARE_PROVIDER_SITE_OTHER): Payer: Medicare HMO | Admitting: Family Medicine

## 2021-11-21 VITALS — BP 154/76 | HR 68 | Temp 98.1°F | Resp 16 | Ht 69.0 in | Wt 198.6 lb

## 2021-11-21 DIAGNOSIS — R051 Acute cough: Secondary | ICD-10-CM | POA: Diagnosis not present

## 2021-11-21 DIAGNOSIS — G479 Sleep disorder, unspecified: Secondary | ICD-10-CM

## 2021-11-21 DIAGNOSIS — R030 Elevated blood-pressure reading, without diagnosis of hypertension: Secondary | ICD-10-CM

## 2021-11-21 DIAGNOSIS — R0981 Nasal congestion: Secondary | ICD-10-CM | POA: Diagnosis not present

## 2021-11-21 MED ORDER — ZOLPIDEM TARTRATE 5 MG PO TABS: ORAL_TABLET | ORAL | 0 refills | Status: DC

## 2021-11-21 MED ORDER — AZITHROMYCIN 250 MG PO TABS: ORAL_TABLET | ORAL | 0 refills | Status: AC

## 2021-11-21 NOTE — Patient Instructions (Signed)
Continue saline nasal spray, Mucinex can be used as needed for cough.  If you are improving tomorrow, this was likely a virus and should continue to improve.  If not improving, can start azithromycin as we discussed.  Have a good trip.  Let me know if there are any questions.   Cough, Adult Coughing is a reflex that clears your throat and your airways (respiratory system). Coughing helps to heal and protect your lungs. It is normal to cough occasionally, but a cough that happens with other symptoms or lasts a long time may be a sign of a condition that needs treatment. An acute cough may only last 2-3 weeks, while a chronic cough may last 8 or more weeks. Coughing is commonly caused by: Infection of the respiratory systemby viruses or bacteria. Breathing in substances that irritate your lungs. Allergies. Asthma. Mucus that runs down the back of your throat (postnasal drip). Smoking. Acid backing up from the stomach into the esophagus (gastroesophageal reflux). Certain medicines. Chronic lung problems. Other medical conditions such as heart failure or a blood clot in the lung (pulmonary embolism). Follow these instructions at home: Medicines Take over-the-counter and prescription medicines only as told by your health care provider. Talk with your health care provider before you take a cough suppressant medicine. Lifestyle  Avoid cigarette smoke. Do not use any products that contain nicotine or tobacco, such as cigarettes, e-cigarettes, and chewing tobacco. If you need help quitting, ask your health care provider. Drink enough fluid to keep your urine pale yellow. Avoid caffeine. Do not drink alcohol if your health care provider tells you not to drink. General instructions  Pay close attention to changes in your cough. Tell your health care provider about them. Always cover your mouth when you cough. Avoid things that make you cough, such as perfume, candles, cleaning products, or campfire  or tobacco smoke. If the air is dry, use a cool mist vaporizer or humidifier in your bedroom or your home to help loosen secretions. If your cough is worse at night, try to sleep in a semi-upright position. Rest as needed. Keep all follow-up visits as told by your health care provider. This is important. Contact a health care provider if you: Have new symptoms. Cough up pus. Have a cough that does not get better after 2-3 weeks or gets worse. Cannot control your cough with cough suppressant medicines and you are losing sleep. Have pain that gets worse or pain that is not helped with medicine. Have a fever. Have unexplained weight loss. Have night sweats. Get help right away if: You cough up blood. You have difficulty breathing. Your heartbeat is very fast. These symptoms may represent a serious problem that is an emergency. Do not wait to see if the symptoms will go away. Get medical help right away. Call your local emergency services (911 in the [place] Do not drive yourself to the hospital. Summary Coughing is a reflex that clears your throat and your airways. It is normal to cough occasionally, but a cough that happens with other symptoms or lasts a long time may be a sign of a condition that needs treatment. Take over-the-counter and prescription medicines only as told by your health care provider. Always cover your mouth when you cough. Contact a health care provider if you have new symptoms or a cough that does not get better after 2-3 weeks or gets worse. This information is not intended to replace advice given to you by your health care provider.  Make sure you discuss any questions you have with your health care provider. Document Revised: 07/08/2018 Document Reviewed: 07/08/2018 Elsevier Patient Education  [place].

## 2021-11-21 NOTE — Progress Notes (Signed)
Subjective:  Patient ID: Stephen Conway, male    DOB: 1946-01-22  Age: 76 y.o. MRN: [id number]  CC:  Chief Complaint  Patient presents with   Cough    Pt notes congestion in his sinus and chest notes sxs started about 1 week ago, denies fever, negative COVID Thursday or Friday last week    Insomnia    Pt would like refill on ambien that he uses PRN notes he is low but this has continued to work well for him     HPI THURMOND HILDEBRAN presents for   Cough/congestion: Started approximately 1 week - 9 days ago.  Initial nasal congestion sinus congestion, some chest congestion.  Min cough. No fever.  Negative COVID test 3 to 4 days ago. One test. Feels well otherwise. Playing golf/tennis.  May be getting better. No sick contacts. Plans on golf trip tomorrow.  Tx: robitussin night, day. Saline sol'n nasal wash  Eating and drinking well.  Planning trip to [facility] tomorrow. More sinus than chest congestion. Slight HA, face pain with blowing nose.  Persistent yellow nasal d/c.   Insomnia Chronic.  Typically treats with melatonin, works well,  rare use of Ambien, 5 mg.Last discussed in September 2022.  Uses more with traveling. Controlled substance database reviewed, last filled #15 on 10/08/2021, previous prescription filled 03/28/2021 also for #15.  History Patient Active Problem List   Diagnosis Date Noted   Rosacea 02/24/2016   Hyperlipidemia 02/24/2016   Erectile dysfunction 02/03/2013   Lipids abnormal 09/25/2011   Personal history of colonic polyps 09/26/2010   Barrett's esophagus 09/26/2010   Past Medical History:  Diagnosis Date   Adenomatous polyp    Allergy    Barrett's esophagus 08/2006   Cataract    GERD (gastroesophageal reflux disease)    Glaucoma    Hyperlipidemia    Internal hemorrhoids 08/2005   Past Surgical History:  Procedure Laterality Date   CATARACT EXTRACTION     COLONOSCOPY     EYE SURGERY     HEMORRHOID SURGERY     KNEE ARTHROSCOPY     UPPER  GASTROINTESTINAL ENDOSCOPY     VASECTOMY     No Known Allergies Prior to Admission medications   Medication Sig Start Date End Date Taking? Authorizing Provider  atorvastatin (LIPITOR) 20 MG tablet Take 1 tablet (20 mg total) by mouth daily. 03/28/21  Yes [name], MD  Dermatological Products, Misc. (DERMAREST ROSACEA EX) Apply topically.   Yes [provider]  fluticasone (FLONASE) 50 MCG/ACT nasal spray Place 1 spray into both nostrils daily. 03/26/20  Yes Posey Boyer, MD  Glucosamine-Chondroit-Vit C-Mn (GLUCOSAMINE 1500 COMPLEX PO) Take by mouth every other day.    Yes [provider]  guaiFENesin-codeine (ROBITUSSIN AC) 100-10 MG/5ML syrup Take 10 mLs by mouth 3 (three) times daily as needed for cough. 12/08/20  Yes Midge Minium, MD  latanoprost (XALATAN) 0.005 % ophthalmic solution 1 drop at bedtime.   Yes [provider]  Melatonin 1 MG TABS Take 1 tablet by mouth at bedtime.   Yes [provider]  omeprazole (PRILOSEC) 20 MG capsule Take 1 capsule (20 mg total) by mouth daily. 03/28/21  Yes [name], MD  sildenafil (REVATIO) 20 MG tablet Take 1-3 tablets (20-60 mg total) by mouth daily as needed. Take 1-3 tablets (20-60 mg) prior to anticipated intercourse 03/28/21  Yes [name], MD  zolpidem (AMBIEN) 5 MG tablet TAKE ONE TABLET BY MOUTH EVERY  NIGHT AT BEDTIME AS NEEDED FOR SLEEP 10/07/21  Yes [name], MD  zaleplon (SONATA) 10 MG capsule 10 mg. One tablet by mouth once a week   09/25/11  [provider]   Social History   Socioeconomic History   Marital status: Married    Spouse name: Not on file   Number of children: 2   Years of education: Not on file   Highest education level: Not on file  Occupational History   Occupation: Retired  Tobacco Use   Smoking status: Former    Types: Pipe   Smokeless tobacco: Never   Tobacco comments:    pr stopped in 2020  Vaping Use   Vaping Use: Never used   Substance and Sexual Activity   Alcohol use: Yes    Alcohol/week: 14.0 standard drinks    Types: 14 Glasses of wine per week    Comment: couple glasses of wine nightly   Drug use: No   Sexual activity: Not on file  Other Topics Concern   Not on file  Social History Narrative   Not on file   Social Determinants of Health   Financial Resource Strain: Not on file  Food Insecurity: Not on file  Transportation Needs: Not on file  Physical Activity: Not on file  Stress: Not on file  Social Connections: Not on file  Intimate Partner Violence: Not on file    Review of Systems Per HPI.   Objective:   Vitals:   11/21/21 0902 11/21/21 0909  BP: (!) 160/78 (!) 154/76  Pulse: 68   Resp: 16   Temp: 98.1 F (36.7 C)   TempSrc: Temporal   SpO2: 97%   Weight: 198 lb 9.6 oz (90.1 kg)   Height: '5\' 9"'$  (1.753 m)      Physical Exam Vitals reviewed.  Constitutional:      Appearance: He is well-developed.  HENT:     Head: Normocephalic and atraumatic.     Right Ear: Tympanic membrane, ear canal and external ear normal.     Left Ear: Tympanic membrane, ear canal and external ear normal.     Nose: No rhinorrhea.     Comments: No focal sinus tenderness with percussion.    Mouth/Throat:     Pharynx: No oropharyngeal exudate or posterior oropharyngeal erythema.  Eyes:     Conjunctiva/sclera: Conjunctivae normal.     Pupils: Pupils are equal, round, and reactive to light.  Neck:     Vascular: No carotid bruit or JVD.  Cardiovascular:     Rate and Rhythm: Normal rate and regular rhythm.     Heart sounds: Normal heart sounds. No murmur heard. Pulmonary:     Effort: Pulmonary effort is normal.     Breath sounds: Normal breath sounds. No wheezing, rhonchi or rales.  Abdominal:     Palpations: Abdomen is soft.     Tenderness: There is no abdominal tenderness.  Musculoskeletal:     Cervical back: Neck supple.     Right lower leg: No edema.     Left lower leg: No edema.   Lymphadenopathy:     Cervical: No cervical adenopathy.  Skin:    General: Skin is warm and dry.     Findings: No rash.  Neurological:     Mental Status: He is alert and oriented to person, place, and time.  Psychiatric:        Mood and Affect: Mood normal.        Behavior: Behavior normal.  Assessment & Plan:  RAIDEN HAYDU is a 76 y.o. male . Acute cough - Plan: azithromycin (ZITHROMAX) 250 MG tablet Sinus congestion - Plan: azithromycin (ZITHROMAX) 250 MG tablet  -New issue.  Reassuring exam, as well as negative home COVID testing late last week.  Slight improvement today.  Likely viral illness.  However with persistent discolored nasal discharge option of azithromycin tomorrow if not continuing to improve.  Potential side effects and risks of antibiotics were discussed.  Continue symptomatic care with saline nasal spray, Robitussin, Mucinex for cough as needed.  RTC precautions  Elevated blood pressure noted, may be related to his cough.  Stable prior.  No med changes for now.  Has follow-up with me in September. BP Readings from Last 3 Encounters:  11/21/21 (!) 154/76  03/28/21 124/88  03/26/20 136/86    Sleep disturbance - Plan: zolpidem (AMBIEN) 5 MG tablet Usually stable with melatonin, requires Ambien when traveling/international travel as tolerated without parasomnias.  Infrequent use, controlled substance database reviewed as above.  Refill ordered.  Meds ordered this encounter  Medications   zolpidem (AMBIEN) 5 MG tablet    Sig: TAKE ONE TABLET BY MOUTH EVERY NIGHT AT BEDTIME AS NEEDED FOR SLEEP    Dispense:  15 tablet    Refill:  0   azithromycin (ZITHROMAX) 250 MG tablet    Sig: Take 2 tablets on day 1, then 1 tablet daily on days 2 through 5    Dispense:  6 tablet    Refill:  0   Patient Instructions  Continue saline nasal spray, Mucinex can be used as needed for cough.  If you are improving tomorrow, this was likely a virus and should continue to  improve.  If not improving, can start azithromycin as we discussed.  Have a good trip.  Let me know if there are any questions.   Cough, Adult Coughing is a reflex that clears your throat and your airways (respiratory system). Coughing helps to heal and protect your lungs. It is normal to cough occasionally, but a cough that happens with other symptoms or lasts a long time may be a sign of a condition that needs treatment. An acute cough may only last 2-3 weeks, while a chronic cough may last 8 or more weeks. Coughing is commonly caused by: Infection of the respiratory systemby viruses or bacteria. Breathing in substances that irritate your lungs. Allergies. Asthma. Mucus that runs down the back of your throat (postnasal drip). Smoking. Acid backing up from the stomach into the esophagus (gastroesophageal reflux). Certain medicines. Chronic lung problems. Other medical conditions such as heart failure or a blood clot in the lung (pulmonary embolism). Follow these instructions at home: Medicines Take over-the-counter and prescription medicines only as told by your health care provider. Talk with your health care provider before you take a cough suppressant medicine. Lifestyle  Avoid cigarette smoke. Do not use any products that contain nicotine or tobacco, such as cigarettes, e-cigarettes, and chewing tobacco. If you need help quitting, ask your health care provider. Drink enough fluid to keep your urine pale yellow. Avoid caffeine. Do not drink alcohol if your health care provider tells you not to drink. General instructions  Pay close attention to changes in your cough. Tell your health care provider about them. Always cover your mouth when you cough. Avoid things that make you cough, such as perfume, candles, cleaning products, or campfire or tobacco smoke. If the air is dry, use a cool mist vaporizer or humidifier  in your bedroom or your home to help loosen secretions. If your cough  is worse at night, try to sleep in a semi-upright position. Rest as needed. Keep all follow-up visits as told by your health care provider. This is important. Contact a health care provider if you: Have new symptoms. Cough up pus. Have a cough that does not get better after 2-3 weeks or gets worse. Cannot control your cough with cough suppressant medicines and you are losing sleep. Have pain that gets worse or pain that is not helped with medicine. Have a fever. Have unexplained weight loss. Have night sweats. Get help right away if: You cough up blood. You have difficulty breathing. Your heartbeat is very fast. These symptoms may represent a serious problem that is an emergency. Do not wait to see if the symptoms will go away. Get medical help right away. Call your local emergency services (911 in the [place] Do not drive yourself to the hospital. Summary Coughing is a reflex that clears your throat and your airways. It is normal to cough occasionally, but a cough that happens with other symptoms or lasts a long time may be a sign of a condition that needs treatment. Take over-the-counter and prescription medicines only as told by your health care provider. Always cover your mouth when you cough. Contact a health care provider if you have new symptoms or a cough that does not get better after 2-3 weeks or gets worse. This information is not intended to replace advice given to you by your health care provider. Make sure you discuss any questions you have with your health care provider. Document Revised: 07/08/2018 Document Reviewed: 07/08/2018 Elsevier Patient Education  [place],   Merri Ray, MD [place], [facility] 11/21/21 9:33 AM

## 2021-12-26 DIAGNOSIS — L570 Actinic keratosis: Secondary | ICD-10-CM | POA: Diagnosis not present

## 2021-12-26 DIAGNOSIS — Z09 Encounter for follow-up examination after completed treatment for conditions other than malignant neoplasm: Secondary | ICD-10-CM | POA: Diagnosis not present

## 2021-12-26 DIAGNOSIS — Z872 Personal history of diseases of the skin and subcutaneous tissue: Secondary | ICD-10-CM | POA: Diagnosis not present

## 2021-12-26 DIAGNOSIS — Z08 Encounter for follow-up examination after completed treatment for malignant neoplasm: Secondary | ICD-10-CM | POA: Diagnosis not present

## 2021-12-26 DIAGNOSIS — L578 Other skin changes due to chronic exposure to nonionizing radiation: Secondary | ICD-10-CM | POA: Diagnosis not present

## 2021-12-26 DIAGNOSIS — D492 Neoplasm of unspecified behavior of bone, soft tissue, and skin: Secondary | ICD-10-CM | POA: Diagnosis not present

## 2021-12-26 DIAGNOSIS — L821 Other seborrheic keratosis: Secondary | ICD-10-CM | POA: Diagnosis not present

## 2021-12-26 DIAGNOSIS — Z86007 Personal history of in-situ neoplasm of skin: Secondary | ICD-10-CM | POA: Diagnosis not present

## 2022-01-17 DIAGNOSIS — H401111 Primary open-angle glaucoma, right eye, mild stage: Secondary | ICD-10-CM | POA: Diagnosis not present

## 2022-01-17 DIAGNOSIS — H04123 Dry eye syndrome of bilateral lacrimal glands: Secondary | ICD-10-CM | POA: Diagnosis not present

## 2022-01-17 DIAGNOSIS — H401122 Primary open-angle glaucoma, left eye, moderate stage: Secondary | ICD-10-CM | POA: Diagnosis not present

## 2022-02-08 ENCOUNTER — Ambulatory Visit (AMBULATORY_SURGERY_CENTER): Payer: Self-pay

## 2022-02-08 VITALS — Ht 69.0 in | Wt 196.0 lb

## 2022-02-08 DIAGNOSIS — Z86010 Personal history of colonic polyps: Secondary | ICD-10-CM

## 2022-02-08 DIAGNOSIS — K2270 Barrett's esophagus without dysplasia: Secondary | ICD-10-CM

## 2022-02-08 MED ORDER — NA SULFATE-K SULFATE-MG SULF 17.5-3.13-1.6 GM/177ML PO SOLN: 1.0000 | Freq: Once | ORAL | 0 refills | Status: AC

## 2022-02-08 NOTE — Progress Notes (Signed)
No egg or soy allergy known to patient  No issues known to pt with past sedation with any surgeries or procedures Patient denies ever being told they had issues or difficulty with intubation  No FH of Malignant Hyperthermia Pt is not on diet pills Pt is not on home 02  Pt is not on blood thinners  Pt denies issues with constipation  No A fib or A flutter Have any cardiac testing pending--NO Pt instructed to use Singlecare.com or GoodRx for a price reduction on prep  Single Care discount card given to patient during PV appt;

## 2022-02-23 ENCOUNTER — Encounter: Payer: Self-pay | Admitting: Gastroenterology

## 2022-03-08 ENCOUNTER — Encounter: Payer: Self-pay | Admitting: Gastroenterology

## 2022-03-08 ENCOUNTER — Ambulatory Visit (AMBULATORY_SURGERY_CENTER): Payer: Medicare HMO | Admitting: Gastroenterology

## 2022-03-08 VITALS — BP 120/78 | HR 63 | Temp 97.7°F | Resp 13 | Ht 69.0 in | Wt 196.0 lb

## 2022-03-08 DIAGNOSIS — K2950 Unspecified chronic gastritis without bleeding: Secondary | ICD-10-CM | POA: Diagnosis not present

## 2022-03-08 DIAGNOSIS — Z86010 Personal history of colonic polyps: Secondary | ICD-10-CM | POA: Diagnosis not present

## 2022-03-08 DIAGNOSIS — K317 Polyp of stomach and duodenum: Secondary | ICD-10-CM

## 2022-03-08 DIAGNOSIS — E785 Hyperlipidemia, unspecified: Secondary | ICD-10-CM | POA: Diagnosis not present

## 2022-03-08 DIAGNOSIS — K2270 Barrett's esophagus without dysplasia: Secondary | ICD-10-CM | POA: Diagnosis not present

## 2022-03-08 DIAGNOSIS — Z809 Family history of malignant neoplasm, unspecified: Secondary | ICD-10-CM

## 2022-03-08 DIAGNOSIS — B3781 Candidal esophagitis: Secondary | ICD-10-CM | POA: Diagnosis not present

## 2022-03-08 DIAGNOSIS — D123 Benign neoplasm of transverse colon: Secondary | ICD-10-CM

## 2022-03-08 MED ORDER — SODIUM CHLORIDE 0.9 % IV SOLN: 500.0000 mL | Freq: Once | INTRAVENOUS | Status: DC

## 2022-03-08 MED ORDER — FLUCONAZOLE 100 MG PO TABS: 100.0000 mg | ORAL_TABLET | Freq: Every day | ORAL | 0 refills | Status: AC

## 2022-03-08 NOTE — Op Note (Signed)
[facility] Patient Name: Stephen Conway Procedure Date: 03/08/2022 7:57 AM MRN: [id number] Endoscopist: Ladene Artist , MD Age: 76 Referring MD:  Date of Birth: 1945-07-27 Gender: Male Account #: [id number] Procedure:                Colonoscopy Indications:              Surveillance: Personal history of adenomatous and                            sessile serrated polyps on last colonoscopy > 5                            years ago Medicines:                Monitored Anesthesia Care Procedure:                Pre-Anesthesia Assessment:                           - Prior to the procedure, a History and Physical                            was performed, and patient medications and                            allergies were reviewed. The patient's tolerance of                            previous anesthesia was also reviewed. The risks                            and benefits of the procedure and the sedation                            options and risks were discussed with the patient.                            All questions were answered, and informed consent                            was obtained. Prior Anticoagulants: The patient has                            taken no previous anticoagulant or antiplatelet                            agents. ASA Grade Assessment: II - A patient with                            mild systemic disease. After reviewing the risks                            and benefits, the patient was deemed in  satisfactory condition to undergo the procedure.                           After obtaining informed consent, the colonoscope                            was passed under direct vision. Throughout the                            procedure, the patient's blood pressure, pulse, and                            oxygen saturations were monitored continuously. The                            Olympus [id number]) Colonoscope was                             introduced through the anus and advanced to the the                            cecum, identified by appendiceal orifice and                            ileocecal valve. The ileocecal valve, appendiceal                            orifice, and rectum were photographed. The quality                            of the bowel preparation was excellent. The                            colonoscopy was performed without difficulty. The                            patient tolerated the procedure well. Scope In: 8:04:45 AM Scope Out: 8:22:54 AM Scope Withdrawal Time: 0 hours 13 minutes 28 seconds  Total Procedure Duration: 0 hours 18 minutes 9 seconds  Findings:                 The perianal and digital rectal examinations were                            normal.                           Three sessile polyps were found in the transverse                            colon. The polyps were 6 to 8 mm in size. These                            polyps were removed with a cold snare. Resection  and retrieval were complete.                           Internal hemorrhoids were found during                            retroflexion. The hemorrhoids were moderate and                            Grade I (internal hemorrhoids that do not prolapse).                           The exam was otherwise without abnormality on                            direct and retroflexion views. Complications:            No immediate complications. Estimated blood loss:                            None. Estimated Blood Loss:     Estimated blood loss: none. Impression:               - Three 6 to 8 mm polyps in the transverse colon,                            removed with a cold snare. Resected and retrieved.                           - Internal hemorrhoids.                           - The examination was otherwise normal on direct                            and retroflexion views. Recommendation:            - Repeat colonoscopy, likely 3 years, after studies                            are complete for surveillance based on pathology                            results.                           - Patient has a contact number available for                            emergencies. The signs and symptoms of potential                            delayed complications were discussed with the                            patient. Return to normal activities tomorrow.  Written discharge instructions were provided to the                            patient.                           - Resume previous diet.                           - Continue present medications.                           - Await pathology results. Ladene Artist, MD 03/08/2022 8:25:57 AM This report has been signed electronically.

## 2022-03-08 NOTE — Progress Notes (Signed)
A and O x3. Report to RN. Tolerated MAC anesthesia well.Teeth unchanged after procedure. 

## 2022-03-08 NOTE — Progress Notes (Signed)
History & Physical  Primary Care Physician:  [name], MD Primary Gastroenterologist: Lucio Edward, MD  CHIEF COMPLAINT: Barrett's esophagus, Personal history of colon polyps   HPI: Stephen Conway is a 76 y.o. male with a history of adenomatous and sessile serrated colon polyps for surveillance colonoscopy.  History of Barrett's esophagus for surveillance EGD.   Past Medical History:  Diagnosis Date   Adenomatous polyp    Allergy    Arthritis    RIGHT hand   Barrett's esophagus 08/03/2006   Cataract    GERD (gastroesophageal reflux disease)    on meds   Glaucoma    Hyperlipidemia    on meds   Internal hemorrhoids 08/03/2005    Past Surgical History:  Procedure Laterality Date   CATARACT EXTRACTION Bilateral    COLONOSCOPY  2015   MS-MAC-movi(good)-hems/SSP x 2   HEMORRHOID SURGERY     KNEE ARTHROSCOPY Left    UPPER GASTROINTESTINAL ENDOSCOPY  2015   barrett's esophagus   VASECTOMY      Prior to Admission medications   Medication Sig Start Date End Date Taking? Authorizing Provider  atorvastatin (LIPITOR) 20 MG tablet Take 1 tablet (20 mg total) by mouth daily. 03/28/21  Yes [name], MD  Dermatological Products, Misc. (DERMAREST ROSACEA EX) Apply topically.   Yes [provider]  fluticasone (FLONASE) 50 MCG/ACT nasal spray Place 1 spray into both nostrils daily. Patient taking differently: Place 1 spray into both nostrils daily as needed. 03/26/20  Yes Posey Boyer, MD  Glucosamine-Chondroit-Vit C-Mn (GLUCOSAMINE 1500 COMPLEX PO) Take 1 tablet by mouth daily.   Yes [provider]  latanoprost (XALATAN) 0.005 % ophthalmic solution 1 drop at bedtime.   Yes [provider]  Melatonin 1 MG TABS Take 1 tablet by mouth at bedtime.   Yes [provider]  omeprazole (PRILOSEC) 20 MG capsule Take 1 capsule (20 mg total) by mouth daily. 03/28/21  Yes [name], MD  SYSTANE ULTRA 0.4-0.3 % SOLN Apply 1 drop  to eye daily as needed. 10/21/21  Yes [provider]  timolol (TIMOPTIC) 0.5 % ophthalmic solution Place 1 drop into both eyes in the morning. 01/24/22  Yes [provider]  zolpidem (AMBIEN) 5 MG tablet TAKE ONE TABLET BY MOUTH EVERY NIGHT AT BEDTIME AS NEEDED FOR SLEEP 11/21/21  Yes [name], MD  sildenafil (REVATIO) 20 MG tablet Take 1-3 tablets (20-60 mg total) by mouth daily as needed. Take 1-3 tablets (20-60 mg) prior to anticipated intercourse 03/28/21   [name], MD  zaleplon (SONATA) 10 MG capsule 10 mg. One tablet by mouth once a week   09/25/11  [provider]    Current Outpatient Medications  Medication Sig Dispense Refill   atorvastatin (LIPITOR) 20 MG tablet Take 1 tablet (20 mg total) by mouth daily. 90 tablet 3   Dermatological Products, Misc. (DERMAREST ROSACEA EX) Apply topically.     fluticasone (FLONASE) 50 MCG/ACT nasal spray Place 1 spray into both nostrils daily. (Patient taking differently: Place 1 spray into both nostrils daily as needed.) 16 g 1   Glucosamine-Chondroit-Vit C-Mn (GLUCOSAMINE 1500 COMPLEX PO) Take 1 tablet by mouth daily.     latanoprost (XALATAN) 0.005 % ophthalmic solution 1 drop at bedtime.     Melatonin 1 MG TABS Take 1 tablet by mouth at bedtime.     omeprazole (PRILOSEC) 20 MG capsule Take 1 capsule (20 mg total) by mouth daily. 90 capsule 3  SYSTANE ULTRA 0.4-0.3 % SOLN Apply 1 drop to eye daily as needed.     timolol (TIMOPTIC) 0.5 % ophthalmic solution Place 1 drop into both eyes in the morning.     zolpidem (AMBIEN) 5 MG tablet TAKE ONE TABLET BY MOUTH EVERY NIGHT AT BEDTIME AS NEEDED FOR SLEEP 15 tablet 0   sildenafil (REVATIO) 20 MG tablet Take 1-3 tablets (20-60 mg total) by mouth daily as needed. Take 1-3 tablets (20-60 mg) prior to anticipated intercourse 20 tablet 5   Current Facility-Administered Medications  Medication Dose Route Frequency Provider Last Rate Last Admin   0.9 %  sodium  chloride infusion  500 mL Intravenous Once Ladene Artist, MD        Allergies as of 03/08/2022   (No Known Allergies)    Family History  Problem Relation Age of Onset   Diabetes Father    Stroke Father    Colon cancer Neg Hx    Colon polyps Neg Hx    Esophageal cancer Neg Hx    Rectal cancer Neg Hx    Stomach cancer Neg Hx     Social History   Socioeconomic History   Marital status: Married    Spouse name: Not on file   Number of children: 2   Years of education: Not on file   Highest education level: Not on file  Occupational History   Occupation: Retired  Tobacco Use   Smoking status: Former    Types: Pipe   Smokeless tobacco: Never   Tobacco comments:    pr stopped in 2020  Vaping Use   Vaping Use: Never used  Substance and Sexual Activity   Alcohol use: Yes    Alcohol/week: 14.0 standard drinks of alcohol    Types: 14 Glasses of wine per week    Comment: couple glasses of wine nightly   Drug use: No   Sexual activity: Not on file  Other Topics Concern   Not on file  Social History Narrative   Not on file   Social Determinants of Health   Financial Resource Strain: Not on file  Food Insecurity: Not on file  Transportation Needs: Not on file  Physical Activity: Not on file  Stress: Not on file  Social Connections: Not on file  Intimate Partner Violence: Not on file    Review of Systems:  All systems reviewed were negative except where noted in HPI.   Physical Exam: General:  Alert, well-developed, in NAD Head:  Normocephalic and atraumatic. Eyes:  Sclera clear, no icterus.   Conjunctiva pink. Ears:  Normal auditory acuity. Mouth:  No deformity or lesions.  Neck:  Supple; no masses . Lungs:  Clear throughout to auscultation.   No wheezes, crackles, or rhonchi. No acute distress. Heart:  Regular rate and rhythm; no murmurs. Abdomen:  Soft, nondistended, nontender. No masses, hepatomegaly. No obvious masses.  Normal bowel .    Rectal:   Deferred   Msk:  Symmetrical without gross deformities.. Pulses:  Normal pulses noted. Extremities:  Without edema. Neurologic:  Alert and  oriented x4;  grossly normal neurologically. Skin:  Intact without significant lesions or rashes. Cervical Nodes:  No significant cervical adenopathy. Psych:  Alert and cooperative. Normal mood and affect.   Impression / Plan:   History of adenomatous and sessile serrated colon polyps for surveillance colonoscopy.  History of Barrett's esophagus for surveillance EGD.  Pricilla Riffle. Fuller Plan  03/08/2022, 7:58 AM See Shea Evans, [facility], to contact our on call  provider

## 2022-03-08 NOTE — Progress Notes (Signed)
Pt's states no medical or surgical changes since previsit or office visit.  VS by [facility]

## 2022-03-08 NOTE — Patient Instructions (Signed)
Handouts on hemorrhoids and polyps given to patient.  Await pathology results. Resume previous diet and continue present medications. Pick up prescription for Diflucan 100 mg daily for 7 days from [facility] [address]  Follow antireflux medications  Consider repeat EGD in 5 years if no dysplasia vs no repeat EGD due to age Repeat colonoscopy in likely 3 years for surveillance.  YOU HAD AN ENDOSCOPIC PROCEDURE TODAY AT [facility]:   Refer to the procedure report that was given to you for any specific questions about what was found during the examination.  If the procedure report does not answer your questions, please call your gastroenterologist to clarify.  If you requested that your care partner not be given the details of your procedure findings, then the procedure report has been included in a sealed envelope for you to review at your convenience later.  YOU SHOULD EXPECT: Some feelings of bloating in the abdomen. Passage of more gas than usual.  Walking can help get rid of the air that was put into your GI tract during the procedure and reduce the bloating. If you had a lower endoscopy (such as a colonoscopy or flexible sigmoidoscopy) you may notice spotting of blood in your stool or on the toilet paper. If you underwent a bowel prep for your procedure, you may not have a normal bowel movement for a few days.  Please Note:  You might notice some irritation and congestion in your nose or some drainage.  This is from the oxygen used during your procedure.  There is no need for concern and it should clear up in a day or so.  SYMPTOMS TO REPORT IMMEDIATELY:  Following lower endoscopy (colonoscopy or flexible sigmoidoscopy):  Excessive amounts of blood in the stool  Significant tenderness or worsening of abdominal pains  Swelling of the abdomen that is new, acute  Fever of 100F or higher  Following upper endoscopy (EGD)  Vomiting of blood or coffee ground  material  New chest pain or pain under the shoulder blades  Painful or persistently difficult swallowing  New shortness of breath  Fever of 100F or higher  Black, tarry-looking stools  For urgent or emergent issues, a gastroenterologist can be reached at any hour by calling [phone]. Do not use MyChart messaging for urgent concerns.    DIET:  We do recommend a small meal at first, but then you may proceed to your regular diet.  Drink plenty of fluids but you should avoid alcoholic beverages for 24 hours.  ACTIVITY:  You should plan to take it easy for the rest of today and you should NOT DRIVE or use heavy machinery until tomorrow (because of the sedation medicines used during the test).    FOLLOW UP: Our staff will call the number listed on your records the next business day following your procedure.  We will call around 7:15- 8:00 am to check on you and address any questions or concerns that you may have regarding the information given to you following your procedure. If we do not reach you, we will leave a message.  If you develop any symptoms (ie: fever, flu-like symptoms, shortness of breath, cough etc.) before then, please call [phone].  If you test positive for Covid 19 in the 2 weeks post procedure, please call and report this information to [place].    If any biopsies were taken you will be contacted by phone or by letter within the next 1-3 weeks.  Please  call us at [phone] if you have not heard about the biopsies in 3 weeks.    SIGNATURES/CONFIDENTIALITY: You and/or your care partner have signed paperwork which will be entered into your electronic medical record.  These signatures attest to the fact that that the information above on your After Visit Summary has been reviewed and is understood.  Full responsibility of the confidentiality of this discharge information lies with you and/or your care-partner.

## 2022-03-08 NOTE — Progress Notes (Signed)
Called to room to assist during endoscopic procedure.  Patient ID and intended procedure confirmed with present staff. Received instructions for my participation in the procedure from the performing physician.  

## 2022-03-08 NOTE — Op Note (Signed)
[facility] Patient Name: Stephen Conway Procedure Date: 03/08/2022 7:57 AM MRN: [id number] Endoscopist: Ladene Artist , MD Age: 76 Referring MD:  Date of Birth: [birth year] Gender: Male Account #: [id number] Procedure:                Upper GI endoscopy Indications:              Surveillance for malignancy due to personal history                            of Barrett's esophagus Medicines:                Monitored Anesthesia Care Procedure:                Pre-Anesthesia Assessment:                           - Prior to the procedure, a History and Physical                            was performed, and patient medications and                            allergies were reviewed. The patient's tolerance of                            previous anesthesia was also reviewed. The risks                            and benefits of the procedure and the sedation                            options and risks were discussed with the patient.                            All questions were answered, and informed consent                            was obtained. Prior Anticoagulants: The patient has                            taken no previous anticoagulant or antiplatelet                            agents. ASA Grade Assessment: II - A patient with                            mild systemic disease. After reviewing the risks                            and benefits, the patient was deemed in                            satisfactory condition to undergo the procedure.  After obtaining informed consent, the endoscope was                            passed under direct vision. Throughout the                            procedure, the patient's blood pressure, pulse, and                            oxygen saturations were monitored continuously. The                            GIF [id number] #[id number] was introduced through the                            mouth, and advanced to the second  part of duodenum.                            The upper GI endoscopy was accomplished without                            difficulty. The patient tolerated the procedure                            well. Scope In: Scope Out: Findings:                 There were esophageal mucosal changes secondary to                            established short-segment Barrett's disease present                            in the distal esophagus. The maximum longitudinal                            extent of these mucosal changes was 1 cm in length.                            Mucosa was biopsied with a cold forceps for                            histology. One specimen bottle was sent to                            pathology.                           Diffuse, white plaques were found in the entire                            esophagus. Biopsies were taken with a cold forceps                            for histology.  The exam of the esophagus was otherwise normal.                           Multiple 4 to 7 mm sessile polyps with no bleeding                            and no stigmata of recent bleeding were found in                            the gastric fundus and in the gastric body.                            Biopsies were taken with a cold forceps for                            histology.                           The exam of the stomach was otherwise normal.                           Patchy mildly erythematous mucosa without active                            bleeding and with no stigmata of bleeding was found                            in the duodenal bulb.                           The exam of the duodenum was otherwise normal. Complications:            No immediate complications. Estimated Blood Loss:     Estimated blood loss was minimal. Impression:               - Esophageal mucosal changes secondary to                            established short-segment Barrett's disease.                             Biopsied.                           - Esophageal plaques were found, consistent with                            candidiasis. Biopsied.                           - Multiple gastric polyps. Biopsied.                           - Erythematous duodenopathy. Recommendation:           - Patient has a contact number available for  emergencies. The signs and symptoms of potential                            delayed complications were discussed with the                            patient. Return to normal activities tomorrow.                            Written discharge instructions were provided to the                            patient.                           - Resume previous diet.                           - Follow antireflux measures.                           - Continue present medications.                           - Diflucan 100 mg po qd x 7d.                           - Await pathology results.                           - Consider repeat EGD in 5 years if no dysplasia vs                            no repeat EGD due to age. Ladene Artist, MD 03/08/2022 8:42:33 AM This report has been signed electronically.

## 2022-03-09 ENCOUNTER — Telehealth: Payer: Self-pay

## 2022-03-09 NOTE — Telephone Encounter (Signed)
  Follow up Call-     03/08/2022    7:13 AM  Call back number  Post procedure Call Back phone  # [phone]  Permission to leave phone message Yes     Patient questions:  Do you have a fever, pain , or abdominal swelling? No. Pain Score  0 *  Have you tolerated food without any problems? Yes.    Have you been able to return to your normal activities? Yes.    Do you have any questions about your discharge instructions: Diet   No. Medications  No. Follow up visit  No.  Do you have questions or concerns about your Care? No.  Actions: * If pain score is 4 or above: No action needed, pain <4.

## 2022-03-14 ENCOUNTER — Ambulatory Visit (INDEPENDENT_AMBULATORY_CARE_PROVIDER_SITE_OTHER): Payer: Medicare HMO | Admitting: Family Medicine

## 2022-03-14 DIAGNOSIS — Z0000 Encounter for general adult medical examination without abnormal findings: Secondary | ICD-10-CM

## 2022-03-14 NOTE — Progress Notes (Signed)
Subjective:   Stephen Conway is a 76 y.o. male who presents for Medicare Annual/Subsequent preventive examination.  Review of Systems    Defer to PCP  I connected with  [name] on 03/14/22 by a audio enabled telemedicine application and verified that I am speaking with the correct person using two identifiers.  Patient Location: Home  Provider Location: Office/Clinic  I discussed the limitations of evaluation and management by telemedicine. The patient expressed understanding and agreed to proceed.      Objective:    There were no vitals filed for this visit. There is no height or weight on file to calculate BMI.     03/28/2021    8:42 AM 03/27/2019    8:12 AM 03/07/2018    8:07 AM 03/01/2017    9:04 AM 01/26/2017    9:05 AM 01/10/2017    8:21 AM 02/24/2016    9:27 AM  Advanced Directives  Does Patient Have a Medical Advance Directive? _0  Yes Yes  Type of Paramedic of [place];Living will Living will;Out of facility DNR (pink MOST or yellow form) [place];Living will Living will Living will;Healthcare Power of [place];Living will   Does patient want to make changes to medical advance directive? No - Patient declined   No - Patient declined     Copy of [place] in Chart?   No - copy requested   No - copy requested Yes    Current Medications (verified) Outpatient Encounter Medications as of 03/14/2022  Medication Sig   atorvastatin (LIPITOR) 20 MG tablet Take 1 tablet (20 mg total) by mouth daily.   Dermatological Products, Misc. (DERMAREST ROSACEA EX) Apply topically.   fluconazole (DIFLUCAN) 100 MG tablet Take 1 tablet (100 mg total) by mouth daily for 7 days.   fluticasone (FLONASE) 50 MCG/ACT nasal spray Place 1 spray into both nostrils daily. (Patient taking differently: Place 1 spray into both nostrils daily as needed.)   Glucosamine-Chondroit-Vit C-Mn  (GLUCOSAMINE 1500 COMPLEX PO) Take 1 tablet by mouth daily.   latanoprost (XALATAN) 0.005 % ophthalmic solution 1 drop at bedtime.   Melatonin 1 MG TABS Take 1 tablet by mouth at bedtime.   omeprazole (PRILOSEC) 20 MG capsule Take 1 capsule (20 mg total) by mouth daily.   sildenafil (REVATIO) 20 MG tablet Take 1-3 tablets (20-60 mg total) by mouth daily as needed. Take 1-3 tablets (20-60 mg) prior to anticipated intercourse   SYSTANE ULTRA 0.4-0.3 % SOLN Apply 1 drop to eye daily as needed.   timolol (TIMOPTIC) 0.5 % ophthalmic solution Place 1 drop into both eyes in the morning.   zolpidem (AMBIEN) 5 MG tablet TAKE ONE TABLET BY MOUTH EVERY NIGHT AT BEDTIME AS NEEDED FOR SLEEP   [DISCONTINUED] zaleplon (SONATA) 10 MG capsule 10 mg. One tablet by mouth once a week    No facility-administered encounter medications on file as of 03/14/2022.    Allergies (verified) Patient has no known allergies.   History: Past Medical History:  Diagnosis Date   Adenomatous polyp    Allergy    Arthritis    RIGHT hand   Barrett's esophagus 08/03/2006   Cataract    GERD (gastroesophageal reflux disease)    on meds   Glaucoma    Hyperlipidemia    on meds   Internal hemorrhoids 08/03/2005   Past Surgical History:  Procedure Laterality Date   CATARACT EXTRACTION Bilateral  COLONOSCOPY  2015   MS-MAC-movi(good)-hems/SSP x 2   HEMORRHOID SURGERY     KNEE ARTHROSCOPY Left    UPPER GASTROINTESTINAL ENDOSCOPY  2015   barrett's esophagus   VASECTOMY     Family History  Problem Relation Age of Onset   Diabetes Father    Stroke Father    Colon cancer Neg Hx    Colon polyps Neg Hx    Esophageal cancer Neg Hx    Rectal cancer Neg Hx    Stomach cancer Neg Hx    Social History   Socioeconomic History   Marital status: Married    Spouse name: Not on file   Number of children: 2   Years of education: Not on file   Highest education level: Not on file  Occupational History   Occupation:  Retired  Tobacco Use   Smoking status: Former    Types: Pipe   Smokeless tobacco: Never   Tobacco comments:    pr stopped in 2020  Vaping Use   Vaping Use: Never used  Substance and Sexual Activity   Alcohol use: Yes    Alcohol/week: 14.0 standard drinks of alcohol    Types: 14 Glasses of wine per week    Comment: couple glasses of wine nightly   Drug use: No   Sexual activity: Not on file  Other Topics Concern   Not on file  Social History Narrative   Not on file   Social Determinants of Health   Financial Resource Strain: Low Risk  (03/14/2022)   Overall Financial Resource Strain (CARDIA)    Difficulty of Paying Living Expenses: Not hard at all  Food Insecurity: No Food Insecurity (03/14/2022)   Hunger Vital Sign    Worried About Running Out of Food in the Last Year: Never true    Ran Out of Food in the Last Year: Never true  Transportation Needs: No Transportation Needs (03/14/2022)   PRAPARE - Hydrologist (Medical): No    Lack of Transportation (Non-Medical): No  Physical Activity: Sufficiently Active (03/14/2022)   Exercise Vital Sign    Days of Exercise per Week: 4 days    Minutes of Exercise per Session: 60 min  Stress: No Stress Concern Present (03/14/2022)   [place]    Feeling of Stress : Only a little  Social Connections: Socially Integrated (03/14/2022)   Social Connection and Isolation Panel [NHANES]    Frequency of Communication with Friends and Family: More than three times a week    Frequency of Social Gatherings with Friends and Family: Three times a week    Attends Religious Services: More than 4 times per year    Active Member of Clubs or Organizations: Yes    Attends Archivist Meetings: More than 4 times per year    Marital Status: Married    Tobacco Counseling Counseling given: Not Answered Tobacco comments: pr stopped in 2020   Clinical  Intake:  Pre-visit preparation completed: Yes        BMI - recorded: 28.94 Nutritional Status: BMI 25 -29 Overweight Nutritional Risks: None Diabetes: No  How often do you need to have someone help you when you read instructions, pamphlets, or other written materials from your doctor or pharmacy?: 1 - Never What is the last grade level you completed in school?: completed [employer]  Diabetic? No  Interpreter Needed?: No      Activities of Daily Living  03/14/2022    8:36 AM 03/10/2022    3:38 PM  In your present state of health, do you have any difficulty performing the following activities:  Hearing? 0 0  Vision? 0 0  Difficulty concentrating or making decisions? 0 0  Walking or climbing stairs? 0 0  Dressing or bathing? 0 0  Doing errands, shopping? 0 0  Preparing Food and eating ?  N  Using the Toilet?  N  In the past six months, have you accidently leaked urine?  N  Do you have problems with loss of bowel control?  N  Managing your Medications?  N  Managing your Finances?  N  Housekeeping or managing your Housekeeping?  N    Patient Care Team: [name], MD as PCP - General (Family Medicine) Macario Carls, MD as Referring Physician (Specialist) Paulla Dolly Tamala Fothergill, DPM as Consulting Physician (Podiatry) Ladene Artist, MD as Consulting Physician (Gastroenterology)  Indicate any recent Medical Services you may have received from other than [facility] providers in the past year (date may be approximate).     Assessment:   This is a routine wellness examination for [employer].  Hearing/Vision screen No results found.  Dietary issues and exercise activities discussed:     Goals Addressed   None   Depression Screen    03/14/2022    8:53 AM 11/21/2021    9:07 AM 03/28/2021    8:02 AM 11/26/2020   10:12 AM 03/26/2020    9:02 AM 03/27/2019    8:08 AM 12/16/2018    1:51 PM  PHQ 2/9 Scores  PHQ - 2 Score 0 0 0 0 0 0 0  PHQ- 9 Score  1 0 0 1      Fall  Risk    03/14/2022    8:53 AM 03/10/2022    3:38 PM 11/21/2021    9:06 AM 03/28/2021    8:02 AM 11/26/2020   10:12 AM  Fall Risk   Falls in the past year? 0 0 0 0 0  Number falls in past yr: 0 0 0 0 0  Injury with Fall? 0 0 0 0 0  Risk for fall due to : No Fall Risks  No Fall Risks  No Fall Risks  Follow up Falls evaluation completed  Falls evaluation completed      FALL RISK PREVENTION PERTAINING TO THE HOME:  Any stairs in or around the home? Yes  If so, are there any without handrails? Yes  Home free of loose throw rugs in walkways, pet beds, electrical cords, etc? No  Adequate lighting in your home to reduce risk of falls? No   ASSISTIVE DEVICES UTILIZED TO PREVENT FALLS:  Life alert? No  Apple Watch  Use of a cane, walker or w/c? No  Grab bars in the bathroom? Yes Shower chair or bench in shower? Yes  Elevated toilet seat or a handicapped toilet?  [place] base    Cognitive Function:        03/14/2022    8:32 AM 03/28/2021    8:38 AM 03/26/2020    8:58 AM 03/27/2019    8:11 AM 03/07/2018    8:04 AM  6CIT Screen  What Year? 0 points 0 points 0 points 0 points 0 points  What month? 0 points 0 points 0 points 0 points 0 points  What time? 0 points 0 points 0 points 0 points 0 points  Count back from 20 0 points 0 points 0 points 0  points 0 points  Months in reverse 0 points 0 points 0 points 0 points 0 points  Repeat phrase 0 points 0 points 0 points 0 points 0 points  Total Score 0 points 0 points 0 points 0 points 0 points    Immunizations Immunization History  Administered Date(s) Administered   Fluad Quad(high Dose 65+) 03/27/2019, 03/26/2020, 03/28/2021   Hepatitis A 02/24/1997, 02/18/2004   Hepatitis A, Adult 07/28/2004   Influenza, High Dose Seasonal PF 03/07/2018   Influenza,inj,Quad PF,6+ Mos 02/24/2016, 03/01/2017   Influenza-Unspecified 03/28/2014   MMR 02/18/2004   PFIZER(Purple Top)SARS-COV-2 Vaccination 07/24/2019, 08/14/2019, 03/26/2020   Pneumococcal  Conjugate-13 01/19/2014   Pneumococcal Polysaccharide-23 08/10/2010   Td 07/03/2002   Tdap 01/19/2014   Zoster Recombinat (Shingrix) 03/28/2019    TDAP status: Up to date  Flu Vaccine status: Due, Education has been provided regarding the importance of this vaccine. Advised may receive this vaccine at local pharmacy or Health Dept. Aware to provide a copy of the vaccination record if obtained from local pharmacy or Health Dept. Verbalized acceptance and understanding.  Pneumococcal vaccine status: Up to date  Covid-19 vaccine status: Completed vaccines  Qualifies for Shingles Vaccine? Yes   Zostavax completed Yes   Shingrix Completed?: No.    Education has been provided regarding the importance of this vaccine. Patient has been advised to call insurance company to determine out of pocket expense if they have not yet received this vaccine. Advised may also receive vaccine at local pharmacy or Health Dept. Verbalized acceptance and understanding.  Screening Tests Health Maintenance  Topic Date Due   Zoster Vaccines- Shingrix (2 of 2) 05/23/2019   COVID-19 Vaccine (4 - Pfizer series) 05/21/2020   INFLUENZA VACCINE  01/31/2022   TETANUS/TDAP  01/20/2024   COLONOSCOPY (Pts 45-85yr Insurance coverage will need to be confirmed)  03/09/2027   Pneumonia Vaccine 76 Years old  Completed   Hepatitis C Screening  Completed   HPV VACCINES  Aged Out    Health Maintenance  Health Maintenance Due  Topic Date Due   Zoster Vaccines- Shingrix (2 of 2) 05/23/2019   COVID-19 Vaccine (4 - Pfizer series) 05/21/2020   INFLUENZA VACCINE  01/31/2022    Colorectal cancer screening: Type of screening: Colonoscopy. Completed 03/08/22. Repeat every 0 years (patient no longer requires colon cancer screening)  Lung Cancer Screening: (Low Dose CT Chest recommended if Age 77-80years, 30 pack-year currently smoking OR have quit w/in 15years.) does qualify.   Lung Cancer Screening Referral:  Declined  Additional Screening:  Hepatitis C Screening: does qualify; Completed 03/01/17  Vision Screening: Recommended annual ophthalmology exams for early detection of glaucoma and other disorders of the eye. Is the patient up to date with their annual eye exam?  Yes Who is the provider or what is the name of the office in which the patient attends annual eye exams? [facility]  If pt is not established with a provider, would they like to be referred to a provider to establish care?  Established with JMerri RayMD .   Dental Screening: Recommended annual dental exams for proper oral hygiene  Community Resource Referral / Chronic Care Management: CRR required this visit?  No   CCM required this visit?  No      Plan:     I have personally reviewed and noted the following in the patient's chart:   Medical and social history Use of alcohol, tobacco or illicit drugs  Current medications and supplements including opioid  prescriptions. Patient is not currently taking opioid prescriptions. Functional ability and status Nutritional status Physical activity Advanced directives List of other physicians Hospitalizations, surgeries, and ER visits in previous 12 months Vitals Screenings to include cognitive, depression, and falls Referrals and appointments  In addition, I have reviewed and discussed with patient certain preventive protocols, quality metrics, and best practice recommendations. A written personalized care plan for preventive services as well as general preventive health recommendations were provided to patient.     [place], [place]   03/14/2022   Nurse Notes:

## 2022-03-22 ENCOUNTER — Encounter: Payer: Self-pay | Admitting: Gastroenterology

## 2022-03-30 ENCOUNTER — Encounter: Payer: Self-pay | Admitting: Family Medicine

## 2022-03-30 ENCOUNTER — Ambulatory Visit (INDEPENDENT_AMBULATORY_CARE_PROVIDER_SITE_OTHER): Payer: Medicare HMO | Admitting: Family Medicine

## 2022-03-30 VITALS — BP 122/80 | HR 65 | Temp 98.2°F | Ht 69.0 in | Wt 200.2 lb

## 2022-03-30 DIAGNOSIS — Z23 Encounter for immunization: Secondary | ICD-10-CM | POA: Diagnosis not present

## 2022-03-30 DIAGNOSIS — N528 Other male erectile dysfunction: Secondary | ICD-10-CM

## 2022-03-30 DIAGNOSIS — Z125 Encounter for screening for malignant neoplasm of prostate: Secondary | ICD-10-CM | POA: Diagnosis not present

## 2022-03-30 DIAGNOSIS — G479 Sleep disorder, unspecified: Secondary | ICD-10-CM

## 2022-03-30 DIAGNOSIS — E785 Hyperlipidemia, unspecified: Secondary | ICD-10-CM

## 2022-03-30 DIAGNOSIS — Z0000 Encounter for general adult medical examination without abnormal findings: Secondary | ICD-10-CM | POA: Diagnosis not present

## 2022-03-30 LAB — COMPREHENSIVE METABOLIC PANEL
ALT: 19 U/L (ref 0–53)
AST: 17 U/L (ref 0–37)
Albumin: 3.9 g/dL (ref 3.5–5.2)
Alkaline Phosphatase: 69 U/L (ref 39–117)
BUN: 13 mg/dL (ref 6–23)
CO2: 30 mEq/L (ref 19–32)
Calcium: 9.2 mg/dL (ref 8.4–10.5)
Chloride: 101 mEq/L (ref 96–112)
Creatinine, Ser: 0.79 mg/dL (ref 0.40–1.50)
GFR: 85.85 mL/min (ref >60.00)
Glucose, Bld: 118 mg/dL — ABNORMAL HIGH (ref 70–99)
Potassium: 4.7 mEq/L (ref 3.5–5.1)
Sodium: 136 mEq/L (ref 135–145)
Total Bilirubin: 0.7 mg/dL (ref 0.2–1.2)
Total Protein: 6.1 g/dL (ref 6.0–8.3)

## 2022-03-30 LAB — LIPID PANEL
Cholesterol: 172 mg/dL (ref 0–200)
HDL: 63.3 mg/dL (ref >39.00)
LDL Cholesterol: 89 mg/dL (ref 0–99)
NonHDL: 108.22
Total CHOL/HDL Ratio: 3
Triglycerides: 95 mg/dL (ref 0.0–149.0)
VLDL: 19 mg/dL (ref 0.0–40.0)

## 2022-03-30 LAB — PSA, MEDICARE: PSA: 0.42 ng/ml (ref 0.10–4.00)

## 2022-03-30 MED ORDER — SILDENAFIL CITRATE 20 MG PO TABS: 20.0000 mg | ORAL_TABLET | Freq: Every day | ORAL | 5 refills | Status: DC | PRN

## 2022-03-30 MED ORDER — ZOLPIDEM TARTRATE 5 MG PO TABS: ORAL_TABLET | ORAL | 0 refills | Status: DC

## 2022-03-30 MED ORDER — ATORVASTATIN CALCIUM 20 MG PO TABS: 20.0000 mg | ORAL_TABLET | Freq: Every day | ORAL | 3 refills | Status: DC

## 2022-03-30 NOTE — Patient Instructions (Addendum)
Here is a link regarding the RSV vaccine  - can be given at your pharmacy if you decide to receive it.  [email or web address] Covid booster at your pharmacy. Flu vaccine given today.  No med changes for now. Thanks for coming in today and take care.   Preventive Care 7 Years and Older, Male Preventive care refers to lifestyle choices and visits with your health care provider that can promote health and wellness. Preventive care visits are also called wellness exams. What can I expect for my preventive care visit? Counseling During your preventive care visit, your health care provider may ask about your: Medical history, including: Past medical problems. Family medical history. History of falls. Current health, including: Emotional well-being. Home life and relationship well-being. Sexual activity. Memory and ability to understand (cognition). Lifestyle, including: Alcohol, nicotine or tobacco, and drug use. Access to firearms. Diet, exercise, and sleep habits. Work and work Statistician. Sunscreen use. Safety issues such as seatbelt and bike helmet use. Physical exam Your health care provider will check your: Height and weight. These may be used to calculate your BMI (body mass index). BMI is a measurement that tells if you are at a healthy weight. Waist circumference. This measures the distance around your waistline. This measurement also tells if you are at a healthy weight and may help predict your risk of certain diseases, such as type 2 diabetes and high blood pressure. Heart rate and blood pressure. Body temperature. Skin for abnormal spots. What immunizations do I need?  Vaccines are usually given at various ages, according to a schedule. Your health care provider will recommend vaccines for you based on your age, medical history, and lifestyle or other factors, such as travel or where you work. What tests do I need? Screening Your  health care provider may recommend screening tests for certain conditions. This may include: Lipid and cholesterol levels. Diabetes screening. This is done by checking your blood sugar (glucose) after you have not eaten for a while (fasting). Hepatitis C test. Hepatitis B test. HIV (human immunodeficiency virus) test. STI (sexually transmitted infection) testing, if you are at risk. Lung cancer screening. Colorectal cancer screening. Prostate cancer screening. Abdominal aortic aneurysm (AAA) screening. You may need this if you are a current or former smoker. Talk with your health care provider about your test results, treatment options, and if necessary, the need for more tests. Follow these instructions at home: Eating and drinking  Eat a diet that includes fresh fruits and vegetables, whole grains, lean protein, and low-fat dairy products. Limit your intake of foods with high amounts of sugar, saturated fats, and salt. Take vitamin and mineral supplements as recommended by your health care provider. Do not drink alcohol if your health care provider tells you not to drink. If you drink alcohol: Limit how much you have to 0-2 drinks a day. Know how much alcohol is in your drink. In the [place] one drink equals one 12 oz bottle of beer (355 mL), one 5 oz glass of wine (148 mL), or one 1 oz glass of hard liquor (44 mL). Lifestyle Brush your teeth every morning and night with fluoride toothpaste. Floss one time each day. Exercise for at least 30 minutes 5 or more days each week. Do not use any products that contain nicotine or tobacco. These products include cigarettes, chewing tobacco, and vaping devices, such as e-cigarettes. If you need help quitting, ask your health care provider. Do not use drugs. If you are sexually active, practice safe  sex. Use a condom or other form of protection to prevent STIs. Take aspirin only as told by your health care provider. Make sure that you understand how  much to take and what form to take. Work with your health care provider to find out whether it is safe and beneficial for you to take aspirin daily. Ask your health care provider if you need to take a cholesterol-lowering medicine (statin). Find healthy ways to manage stress, such as: Meditation, yoga, or listening to music. Journaling. Talking to a trusted person. Spending time with friends and family. Safety Always wear your seat belt while driving or riding in a vehicle. Do not drive: If you have been drinking alcohol. Do not ride with someone who has been drinking. When you are tired or distracted. While texting. If you have been using any mind-altering substances or drugs. Wear a helmet and other protective equipment during sports activities. If you have firearms in your house, make sure you follow all gun safety procedures. Minimize exposure to UV radiation to reduce your risk of skin cancer. What's next? Visit your health care provider once a year for an annual wellness visit. Ask your health care provider how often you should have your eyes and teeth checked. Stay up to date on all vaccines. This information is not intended to replace advice given to you by your health care provider. Make sure you discuss any questions you have with your health care provider. Document Revised: 12/15/2020 Document Reviewed: 12/15/2020 Elsevier Patient Education  [place].

## 2022-03-30 NOTE — Progress Notes (Signed)
Subjective:  Patient ID: Stephen Conway, male    DOB: December 05, 1945  Age: 76 y.o. MRN: [id number]  CC:  Chief Complaint  Patient presents with   Annual Exam    Pt states all is well Pt is fasting    HPI Stephen Conway presents for Annual Exam PCP, me Dermatology, Dr. Pearline Cables, Dr. Winifred Olive.  Prior squamous cell of face.  Dermatology appointment June 26. Gastroenterology Dr. Fuller Plan, endoscopy, colonoscopy September 6.  On omeprazole for GERD. Podiatry Dr. Paulla Dolly. Ophthalmology, Dr. Lucianne Lei, glaucoma, dry eye syndrome.  No change in health since last visit.   Hyperlipidemia: Lipitor 20 mg daily. No new myalgias/side effects  Lab Results  Component Value Date   CHOL 186 03/28/2021   HDL 77.10 03/28/2021   LDLCALC 97 03/28/2021   TRIG 63.0 03/28/2021   CHOLHDL 2 03/28/2021   Lab Results  Component Value Date   ALT 20 03/28/2021   AST 20 03/28/2021   ALKPHOS 66 03/28/2021   BILITOT 0.8 03/28/2021   Insomnia, stable with rare ambien few times per month - more overseas. Melatonin other times.    Erectile dysfunction Treated with Revatio 20 mg, 3 at a time. Effective. No HA/flushing, no hearing/vision changes or CP with exertion.      03/30/2022    8:01 AM 03/14/2022    8:53 AM 11/21/2021    9:07 AM 03/28/2021    8:02 AM 11/26/2020   10:12 AM  Depression screen PHQ 2/9  Decreased Interest 0 0 0 0 0  Down, Depressed, Hopeless 0 0 0 0 0  PHQ - 2 Score 0 0 0 0 0  Altered sleeping 0  1 0 0  Tired, decreased energy 0  0 0 0  Change in appetite 0  0 0 0  Feeling bad or failure about yourself  0  0 0 0  Trouble concentrating 0  0 0 0  Moving slowly or fidgety/restless 0  0 0 0  Suicidal thoughts 0  0 0 0  PHQ-9 Score 0  1 0 0  Difficult doing work/chores    Not difficult at all Not difficult at all    Health Maintenance  Topic Date Due   INFLUENZA VACCINE  01/31/2022   COVID-19 Vaccine (4 - Pfizer series) 04/15/2022 (Originally 05/21/2020)   TETANUS/TDAP  01/20/2024    COLONOSCOPY (Pts 45-12yr Insurance coverage will need to be confirmed)  03/08/2025   Pneumonia Vaccine 76 Years old  Completed   Hepatitis C Screening  Completed   Zoster Vaccines- Shingrix  Completed   HPV VACCINES  Aged Out  Colonoscopy as above, September 6, 3-year repeat, history of polyps. Prostate: does not have family history of prostate cancer The natural history of prostate cancer and ongoing controversy regarding screening and potential treatment outcomes of prostate cancer has been discussed with the patient. The meaning of a false positive PSA and a false negative PSA has been discussed.  Risks of screening including after age 5666discussed.  He indicates understanding of the limitations of this screening test and wishes  to proceed with screening PSA testing. DRE deferred for now.  Lab Results  Component Value Date   PSA1 0.5 03/26/2020   PSA1 0.5 03/27/2019   PSA1 0.6 03/07/2018   PSA 0.46 03/28/2021   PSA 0.5 02/24/2016   PSA 0.59 02/08/2015    Immunization History  Administered Date(s) Administered   Fluad Quad(high Dose 65+) 03/27/2019, 03/26/2020, 03/28/2021   Hepatitis A 02/24/1997, 02/18/2004  Hepatitis A, Adult 07/28/2004   Influenza, High Dose Seasonal PF 03/07/2018   Influenza,inj,Quad PF,6+ Mos 02/24/2016, 03/01/2017   Influenza-Unspecified 03/28/2014   MMR 02/18/2004   PFIZER(Purple Top)SARS-COV-2 Vaccination 07/24/2019, 08/14/2019, 03/26/2020   Pneumococcal Conjugate-13 01/19/2014   Pneumococcal Polysaccharide-23 08/10/2010   Td 07/03/2002   Tdap 01/19/2014   Zoster Recombinat (Shingrix) 03/28/2019   Zoster, Unspecified 04/20/2021  Flu vaccine: today.  COVID booster: recommended  RSV vaccine: declines.  2nd shingrix last year at pharmacy.   No results found. Ophthalmology follow-up as above, recent visit.  Dental: every 6-9 months.   Alcohol: 2 per day 6 d/week.   Tobacco: none  Exercise: daily, walking 18holes per golf, tennis, pickle ball.    Has HCPOA, living will.    History Patient Active Problem List   Diagnosis Date Noted   Rosacea 02/24/2016   Hyperlipidemia 02/24/2016   Erectile dysfunction 02/03/2013   Lipids abnormal 09/25/2011   Personal history of colonic polyps 09/26/2010   Barrett's esophagus 09/26/2010   Past Medical History:  Diagnosis Date   Adenomatous polyp    Allergy    Arthritis    RIGHT hand   Barrett's esophagus 08/03/2006   Cataract    GERD (gastroesophageal reflux disease)    on meds   Glaucoma    Hyperlipidemia    on meds   Internal hemorrhoids 08/03/2005   Past Surgical History:  Procedure Laterality Date   CATARACT EXTRACTION Bilateral    COLONOSCOPY  2015   MS-MAC-movi(good)-hems/SSP x 2   HEMORRHOID SURGERY     KNEE ARTHROSCOPY Left    UPPER GASTROINTESTINAL ENDOSCOPY  2015   barrett's esophagus   VASECTOMY     No Known Allergies Prior to Admission medications   Medication Sig Start Date End Date Taking? Authorizing Provider  atorvastatin (LIPITOR) 20 MG tablet Take 1 tablet (20 mg total) by mouth daily. 03/28/21  Yes [name], MD  fluticasone (FLONASE) 50 MCG/ACT nasal spray Place 1 spray into both nostrils daily. 03/26/20  Yes Posey Boyer, MD  Glucosamine-Chondroit-Vit C-Mn (GLUCOSAMINE 1500 COMPLEX PO) Take 1 tablet by mouth daily.   Yes [provider]  latanoprost (XALATAN) 0.005 % ophthalmic solution 1 drop at bedtime.   Yes [provider]  Melatonin 1 MG TABS Take 1 tablet by mouth at bedtime.   Yes [provider]  omeprazole (PRILOSEC) 20 MG capsule Take 1 capsule (20 mg total) by mouth daily. 03/28/21  Yes [name], MD  sildenafil (REVATIO) 20 MG tablet Take 1-3 tablets (20-60 mg total) by mouth daily as needed. Take 1-3 tablets (20-60 mg) prior to anticipated intercourse 03/28/21  Yes [name], MD  SYSTANE ULTRA 0.4-0.3 % SOLN Apply 1 drop to eye daily as needed. 10/21/21  Yes [provider]   timolol (TIMOPTIC) 0.5 % ophthalmic solution Place 1 drop into both eyes in the morning. 01/24/22  Yes [provider]  zolpidem (AMBIEN) 5 MG tablet TAKE ONE TABLET BY MOUTH EVERY NIGHT AT BEDTIME AS NEEDED FOR SLEEP 11/21/21  Yes [name], MD  Dermatological Products, Misc. (DERMAREST ROSACEA EX) Apply topically. Patient not taking: Reported on 03/30/2022    [provider]  zaleplon (SONATA) 10 MG capsule 10 mg. One tablet by mouth once a week   09/25/11  [provider]   Social History   Socioeconomic History   Marital status: Married    Spouse name: Not on file   Number of children: 2  Years of education: Not on file   Highest education level: Not on file  Occupational History   Occupation: Retired  Tobacco Use   Smoking status: Former    Types: Pipe   Smokeless tobacco: Never   Tobacco comments:    pr stopped in 2020  Vaping Use   Vaping Use: Never used  Substance and Sexual Activity   Alcohol use: Yes    Alcohol/week: 14.0 standard drinks of alcohol    Types: 14 Glasses of wine per week    Comment: couple glasses of wine nightly   Drug use: No   Sexual activity: Not on file  Other Topics Concern   Not on file  Social History Narrative   Not on file   Social Determinants of Health   Financial Resource Strain: Low Risk  (03/14/2022)   Overall Financial Resource Strain (CARDIA)    Difficulty of Paying Living Expenses: Not hard at all  Food Insecurity: No Food Insecurity (03/14/2022)   Hunger Vital Sign    Worried About Running Out of Food in the Last Year: Never true    Ran Out of Food in the Last Year: Never true  Transportation Needs: No Transportation Needs (03/14/2022)   PRAPARE - Hydrologist (Medical): No    Lack of Transportation (Non-Medical): No  Physical Activity: Sufficiently Active (03/14/2022)   Exercise Vital Sign    Days of Exercise per Week: 4 days    Minutes of Exercise per Session: 60  min  Stress: No Stress Concern Present (03/14/2022)   [place]    Feeling of Stress : Only a little  Social Connections: Socially Integrated (03/14/2022)   Social Connection and Isolation Panel [NHANES]    Frequency of Communication with Friends and Family: More than three times a week    Frequency of Social Gatherings with Friends and Family: Three times a week    Attends Religious Services: More than 4 times per year    Active Member of Clubs or Organizations: Yes    Attends Archivist Meetings: More than 4 times per year    Marital Status: Married  Human resources officer Violence: Not At Risk (03/14/2022)   Humiliation, Afraid, Rape, and Kick questionnaire    Fear of Current or Ex-Partner: No    Emotionally Abused: No    Physically Abused: No    Sexually Abused: No    Review of Systems 13 point review of systems per patient health survey noted.  Negative other than as indicated above or in HPI.    Objective:   Vitals:   03/30/22 0810  BP: 122/80  Pulse: 65  Temp: 98.2 F (36.8 C)  SpO2: 97%  Weight: 200 lb 3.2 oz (90.8 kg)  Height: _0  (1.753 m)     Physical Exam Vitals reviewed.  Constitutional:      Appearance: He is well-developed.  HENT:     Head: Normocephalic and atraumatic.     Right Ear: External ear normal.     Left Ear: External ear normal.  Eyes:     Conjunctiva/sclera: Conjunctivae normal.     Pupils: Pupils are equal, round, and reactive to light.  Neck:     Thyroid: No thyromegaly.  Cardiovascular:     Rate and Rhythm: Normal rate and regular rhythm.     Heart sounds: Normal heart sounds.  Pulmonary:     Effort: Pulmonary effort is normal. No respiratory distress.  Breath sounds: Normal breath sounds. No wheezing.  Abdominal:     General: There is no distension.     Palpations: Abdomen is soft.     Tenderness: There is no abdominal tenderness.  Musculoskeletal:         General: No tenderness. Normal range of motion.     Cervical back: Normal range of motion and neck supple.  Lymphadenopathy:     Cervical: No cervical adenopathy.  Skin:    General: Skin is warm and dry.  Neurological:     Mental Status: He is alert and oriented to person, place, and time.     Deep Tendon Reflexes: Reflexes are normal and symmetric.  Psychiatric:        Behavior: Behavior normal.        Assessment & Plan:  KYREN KNICK is a 76 y.o. male . Annual physical exam  - -anticipatory guidance as below in AVS, screening labs above. Health maintenance items as above in HPI discussed/recommended as applicable. Option of RSV vaccine and covid booster at pharmacy.   Need for influenza vaccination - Plan: Flu Vaccine QUAD High Dose(Fluad)  Hyperlipidemia, unspecified hyperlipidemia type - Plan: atorvastatin (LIPITOR) 20 MG tablet, Comprehensive metabolic panel, Lipid panel  -  Stable, tolerating current regimen. Medications refilled. Labs pending as above.   Other male erectile dysfunction - Plan: sildenafil (REVATIO) 20 MG tablet  -  Stable, tolerating current regimen. viagra Rx given - use lowest effective dose. Side effects discussed (including but not limited to headache/flushing, blue discoloration of vision, possible vascular steal and risk of cardiac effects if underlying unknown coronary artery disease, and permanent sensorineural hearing loss). Understanding expressed.  Sleep disturbance - Plan: zolpidem (AMBIEN) 5 MG tablet  - infrequent use. Continue low dose ambien if needed. Melatonin.   Screening for prostate cancer - Plan: PSA, Medicare ( [place] only)   Meds ordered this encounter  Medications   atorvastatin (LIPITOR) 20 MG tablet    Sig: Take 1 tablet (20 mg total) by mouth daily.    Dispense:  90 tablet    Refill:  3   sildenafil (REVATIO) 20 MG tablet    Sig: Take 1-3 tablets (20-60 mg total) by mouth daily as needed. Take 1-3 tablets  (20-60 mg) prior to anticipated intercourse    Dispense:  20 tablet    Refill:  5   zolpidem (AMBIEN) 5 MG tablet    Sig: TAKE ONE TABLET BY MOUTH EVERY NIGHT AT BEDTIME AS NEEDED FOR SLEEP    Dispense:  15 tablet    Refill:  0   Patient Instructions  Here is a link regarding the RSV vaccine  - can be given at your pharmacy if you decide to receive it.  [email or web address] Covid booster at your pharmacy. Flu vaccine given today.  No med changes for now. Thanks for coming in today and take care.   Preventive Care 54 Years and Older, Male Preventive care refers to lifestyle choices and visits with your health care provider that can promote health and wellness. Preventive care visits are also called wellness exams. What can I expect for my preventive care visit? Counseling During your preventive care visit, your health care provider may ask about your: Medical history, including: Past medical problems. Family medical history. History of falls. Current health, including: Emotional well-being. Home life and relationship well-being. Sexual activity. Memory and ability to understand (cognition). Lifestyle, including: Alcohol, nicotine or tobacco, and drug use. Access to firearms. Diet, exercise,  and sleep habits. Work and work Statistician. Sunscreen use. Safety issues such as seatbelt and bike helmet use. Physical exam Your health care provider will check your: Height and weight. These may be used to calculate your BMI (body mass index). BMI is a measurement that tells if you are at a healthy weight. Waist circumference. This measures the distance around your waistline. This measurement also tells if you are at a healthy weight and may help predict your risk of certain diseases, such as type 2 diabetes and high blood pressure. Heart rate and blood pressure. Body temperature. Skin for abnormal spots. What immunizations do I need?  Vaccines  are usually given at various ages, according to a schedule. Your health care provider will recommend vaccines for you based on your age, medical history, and lifestyle or other factors, such as travel or where you work. What tests do I need? Screening Your health care provider may recommend screening tests for certain conditions. This may include: Lipid and cholesterol levels. Diabetes screening. This is done by checking your blood sugar (glucose) after you have not eaten for a while (fasting). Hepatitis C test. Hepatitis B test. HIV (human immunodeficiency virus) test. STI (sexually transmitted infection) testing, if you are at risk. Lung cancer screening. Colorectal cancer screening. Prostate cancer screening. Abdominal aortic aneurysm (AAA) screening. You may need this if you are a current or former smoker. Talk with your health care provider about your test results, treatment options, and if necessary, the need for more tests. Follow these instructions at home: Eating and drinking  Eat a diet that includes fresh fruits and vegetables, whole grains, lean protein, and low-fat dairy products. Limit your intake of foods with high amounts of sugar, saturated fats, and salt. Take vitamin and mineral supplements as recommended by your health care provider. Do not drink alcohol if your health care provider tells you not to drink. If you drink alcohol: Limit how much you have to 0-2 drinks a day. Know how much alcohol is in your drink. In the [place] one drink equals one 12 oz bottle of beer (355 mL), one 5 oz glass of wine (148 mL), or one 1 oz glass of hard liquor (44 mL). Lifestyle Brush your teeth every morning and night with fluoride toothpaste. Floss one time each day. Exercise for at least 30 minutes 5 or more days each week. Do not use any products that contain nicotine or tobacco. These products include cigarettes, chewing tobacco, and vaping devices, such as e-cigarettes. If you need  help quitting, ask your health care provider. Do not use drugs. If you are sexually active, practice safe sex. Use a condom or other form of protection to prevent STIs. Take aspirin only as told by your health care provider. Make sure that you understand how much to take and what form to take. Work with your health care provider to find out whether it is safe and beneficial for you to take aspirin daily. Ask your health care provider if you need to take a cholesterol-lowering medicine (statin). Find healthy ways to manage stress, such as: Meditation, yoga, or listening to music. Journaling. Talking to a trusted person. Spending time with friends and family. Safety Always wear your seat belt while driving or riding in a vehicle. Do not drive: If you have been drinking alcohol. Do not ride with someone who has been drinking. When you are tired or distracted. While texting. If you have been using any mind-altering substances or drugs. Wear a helmet  and other protective equipment during sports activities. If you have firearms in your house, make sure you follow all gun safety procedures. Minimize exposure to UV radiation to reduce your risk of skin cancer. What's next? Visit your health care provider once a year for an annual wellness visit. Ask your health care provider how often you should have your eyes and teeth checked. Stay up to date on all vaccines. This information is not intended to replace advice given to you by your health care provider. Make sure you discuss any questions you have with your health care provider. Document Revised: 12/15/2020 Document Reviewed: 12/15/2020 Elsevier Patient Education  [place],   Merri Ray, MD [place] 03/30/22 8:37 AM

## 2022-04-19 ENCOUNTER — Other Ambulatory Visit: Payer: Self-pay | Admitting: Family Medicine

## 2022-04-19 DIAGNOSIS — K2270 Barrett's esophagus without dysplasia: Secondary | ICD-10-CM

## 2022-06-16 DIAGNOSIS — E755 Other lipid storage disorders: Secondary | ICD-10-CM | POA: Diagnosis not present

## 2022-06-16 DIAGNOSIS — K2270 Barrett's esophagus without dysplasia: Secondary | ICD-10-CM | POA: Diagnosis not present

## 2022-06-16 DIAGNOSIS — G4700 Insomnia, unspecified: Secondary | ICD-10-CM | POA: Diagnosis not present

## 2022-06-16 DIAGNOSIS — H409 Unspecified glaucoma: Secondary | ICD-10-CM | POA: Diagnosis not present

## 2022-06-16 DIAGNOSIS — E785 Hyperlipidemia, unspecified: Secondary | ICD-10-CM | POA: Diagnosis not present

## 2022-06-16 DIAGNOSIS — K219 Gastro-esophageal reflux disease without esophagitis: Secondary | ICD-10-CM | POA: Diagnosis not present

## 2022-06-16 DIAGNOSIS — Z823 Family history of stroke: Secondary | ICD-10-CM | POA: Diagnosis not present

## 2022-06-16 DIAGNOSIS — N529 Male erectile dysfunction, unspecified: Secondary | ICD-10-CM | POA: Diagnosis not present

## 2022-07-24 DIAGNOSIS — L298 Other pruritus: Secondary | ICD-10-CM | POA: Diagnosis not present

## 2022-07-24 DIAGNOSIS — T887XXA Unspecified adverse effect of drug or medicament, initial encounter: Secondary | ICD-10-CM | POA: Diagnosis not present

## 2022-10-13 ENCOUNTER — Other Ambulatory Visit: Payer: Self-pay | Admitting: Family Medicine

## 2022-10-13 DIAGNOSIS — E785 Hyperlipidemia, unspecified: Secondary | ICD-10-CM

## 2022-10-24 DIAGNOSIS — M25511 Pain in right shoulder: Secondary | ICD-10-CM | POA: Diagnosis not present

## 2022-10-25 ENCOUNTER — Telehealth: Payer: Self-pay | Admitting: Family Medicine

## 2022-10-25 NOTE — Telephone Encounter (Signed)
Called patient to schedule Medicare Annual Wellness Visit (AWV). Left message for patient to call back and schedule Medicare Annual Wellness Visit (AWV).  Last date of AWV: 03/14/2022 Patient has [employer] which can be scheduled calendar year.  Please schedule an AWVS appointment at any time with [facility] ANNUAL WELLNESS VISIT.  If any questions, please contact me at [phone].    Thank you,  [facility] Support [facility] Direct dial  [phone]

## 2022-10-26 ENCOUNTER — Telehealth: Payer: Self-pay | Admitting: Family Medicine

## 2022-10-26 NOTE — Telephone Encounter (Signed)
Contacted Stephen Conway to schedule their annual wellness visit. Appointment made for 11/01/2022.  Thank you,  [facility] Support [facility] Direct dial  [phone]

## 2022-11-01 ENCOUNTER — Ambulatory Visit (INDEPENDENT_AMBULATORY_CARE_PROVIDER_SITE_OTHER): Payer: Medicare HMO | Admitting: *Deleted

## 2022-11-01 DIAGNOSIS — Z0000 Encounter for general adult medical examination without abnormal findings: Secondary | ICD-10-CM | POA: Diagnosis not present

## 2022-11-01 NOTE — Patient Instructions (Signed)
Stephen Conway , Thank you for taking time to come for your Medicare Wellness Visit. I appreciate your ongoing commitment to your health goals. Please review the following plan we discussed and let me know if I can assist you in the future.   Screening recommendations/referrals: Colonoscopy: up to date Recommended yearly ophthalmology/optometry visit for glaucoma screening and checkup Recommended yearly dental visit for hygiene and checkup  Vaccinations: Influenza vaccine: up to date Pneumococcal vaccine: up to date Tdap vaccine: up to date Shingles vaccine: up to date    Advanced directives: on file      Preventive Care 65 Years and Older, Male Preventive care refers to lifestyle choices and visits with your health care provider that can promote health and wellness. What does preventive care include? A yearly physical exam. This is also called an annual well check. Dental exams once or twice a year. Routine eye exams. Ask your health care provider how often you should have your eyes checked. Personal lifestyle choices, including: Daily care of your teeth and gums. Regular physical activity. Eating a healthy diet. Avoiding tobacco and drug use. Limiting alcohol use. Practicing safe sex. Taking low doses of aspirin every day. Taking vitamin and mineral supplements as recommended by your health care provider. What happens during an annual well check? The services and screenings done by your health care provider during your annual well check will depend on your age, overall health, lifestyle risk factors, and family history of disease. Counseling  Your health care provider may ask you questions about your: Alcohol use. Tobacco use. Drug use. Emotional well-being. Home and relationship well-being. Sexual activity. Eating habits. History of falls. Memory and ability to understand (cognition). Work and work Astronomer. Screening  You may have the following tests or  measurements: Height, weight, and BMI. Blood pressure. Lipid and cholesterol levels. These may be checked every 5 years, or more frequently if you are over 66 years old. Skin check. Lung cancer screening. You may have this screening every year starting at age 80 if you have a 30-pack-year history of smoking and currently smoke or have quit within the past 15 years. Fecal occult blood test (FOBT) of the stool. You may have this test every year starting at age 68. Flexible sigmoidoscopy or colonoscopy. You may have a sigmoidoscopy every 5 years or a colonoscopy every 10 years starting at age 69. Prostate cancer screening. Recommendations will vary depending on your family history and other risks. Hepatitis C blood test. Hepatitis B blood test. Sexually transmitted disease (STD) testing. Diabetes screening. This is done by checking your blood sugar (glucose) after you have not eaten for a while (fasting). You may have this done every 1-3 years. Abdominal aortic aneurysm (AAA) screening. You may need this if you are a current or former smoker. Osteoporosis. You may be screened starting at age 67 if you are at high risk. Talk with your health care provider about your test results, treatment options, and if necessary, the need for more tests. Vaccines  Your health care provider may recommend certain vaccines, such as: Influenza vaccine. This is recommended every year. Tetanus, diphtheria, and acellular pertussis (Tdap, Td) vaccine. You may need a Td booster every 10 years. Zoster vaccine. You may need this after age 24. Pneumococcal 13-valent conjugate (PCV13) vaccine. One dose is recommended after age 63. Pneumococcal polysaccharide (PPSV23) vaccine. One dose is recommended after age 80. Talk to your health care provider about which screenings and vaccines you need and how often you  need them. This information is not intended to replace advice given to you by your health care provider. Make sure  you discuss any questions you have with your health care provider. Document Released: 07/16/2015 Document Revised: 03/08/2016 Document Reviewed: 04/20/2015 Elsevier Interactive Patient Education  2017 [employer].  Fall Prevention in the Home Falls can cause injuries. They can happen to people of all ages. There are many things you can do to make your home safe and to help prevent falls. What can I do on the outside of my home? Regularly fix the edges of walkways and driveways and fix any cracks. Remove anything that might make you trip as you walk through a door, such as a raised step or threshold. Trim any bushes or trees on the path to your home. Use bright outdoor lighting. Clear any walking paths of anything that might make someone trip, such as rocks or tools. Regularly check to see if handrails are loose or broken. Make sure that both sides of any steps have handrails. Any raised decks and porches should have guardrails on the edges. Have any leaves, snow, or ice cleared regularly. Use sand or salt on walking paths during winter. Clean up any spills in your garage right away. This includes oil or grease spills. What can I do in the bathroom? Use night lights. Install grab bars by the toilet and in the tub and shower. Do not use towel bars as grab bars. Use non-skid mats or decals in the tub or shower. If you need to sit down in the shower, use a plastic, non-slip stool. Keep the floor dry. Clean up any water that spills on the floor as soon as it happens. Remove soap buildup in the tub or shower regularly. Attach bath mats securely with double-sided non-slip rug tape. Do not have throw rugs and other things on the floor that can make you trip. What can I do in the bedroom? Use night lights. Make sure that you have a light by your bed that is easy to reach. Do not use any sheets or blankets that are too big for your bed. They should not hang down onto the floor. Have a firm  chair that has side arms. You can use this for support while you get dressed. Do not have throw rugs and other things on the floor that can make you trip. What can I do in the kitchen? Clean up any spills right away. Avoid walking on wet floors. Keep items that you use a lot in easy-to-reach places. If you need to reach something above you, use a strong step stool that has a grab bar. Keep electrical cords out of the way. Do not use floor polish or wax that makes floors slippery. If you must use wax, use non-skid floor wax. Do not have throw rugs and other things on the floor that can make you trip. What can I do with my stairs? Do not leave any items on the stairs. Make sure that there are handrails on both sides of the stairs and use them. Fix handrails that are broken or loose. Make sure that handrails are as long as the stairways. Check any carpeting to make sure that it is firmly attached to the stairs. Fix any carpet that is loose or worn. Avoid having throw rugs at the top or bottom of the stairs. If you do have throw rugs, attach them to the floor with carpet tape. Make sure that you have a light switch  at the top of the stairs and the bottom of the stairs. If you do not have them, ask someone to add them for you. What else can I do to help prevent falls? Wear shoes that: Do not have high heels. Have rubber bottoms. Are comfortable and fit you well. Are closed at the toe. Do not wear sandals. If you use a stepladder: Make sure that it is fully opened. Do not climb a closed stepladder. Make sure that both sides of the stepladder are locked into place. Ask someone to hold it for you, if possible. Clearly mark and make sure that you can see: Any grab bars or handrails. First and last steps. Where the edge of each step is. Use tools that help you move around (mobility aids) if they are needed. These include: Canes. Walkers. Scooters. Crutches. Turn on the lights when you go  into a dark area. Replace any light bulbs as soon as they burn out. Set up your furniture so you have a clear path. Avoid moving your furniture around. If any of your floors are uneven, fix them. If there are any pets around you, be aware of where they are. Review your medicines with your doctor. Some medicines can make you feel dizzy. This can increase your chance of falling. Ask your doctor what other things that you can do to help prevent falls. This information is not intended to replace advice given to you by your health care provider. Make sure you discuss any questions you have with your health care provider. Document Released: 04/15/2009 Document Revised: 11/25/2015 Document Reviewed: 07/24/2014 Elsevier Interactive Patient Education  2017 [employer].

## 2022-11-01 NOTE — Progress Notes (Signed)
Subjective:   Stephen Conway is a 77 y.o. male who presents for Medicare Annual/Subsequent preventive examination.  I connected with  Stephen Conway on 11/01/22 by a telephone enabled telemedicine application and verified that I am speaking with the correct person using two identifiers.   I discussed the limitations of evaluation and management by telemedicine. The patient expressed understanding and agreed to proceed.  Patient location: home  Provider location: telephone/home     Review of Systems     Cardiac Risk Factors include: advanced age (>60men, >74 women);male gender     Objective:    Today's Vitals   There is no height or weight on file to calculate BMI.     11/01/2022    8:07 AM 03/30/2022    8:04 AM 03/28/2021    8:42 AM 03/27/2019    8:12 AM 03/07/2018    8:07 AM 03/01/2017    9:04 AM 01/26/2017    9:05 AM  Advanced Directives  Does Patient Have a Medical Advance Directive? Yes Yes Yes Yes Yes Yes Yes  Type of Estate agent of [employer] Power of [place];Living will Living will;Out of facility DNR (pink MOST or yellow form) Healthcare Power of [place];Living will Living will Living will;Healthcare Power of Attorney  Does patient want to make changes to medical advance directive?   No - Patient declined   No - Patient declined   Copy of Healthcare Power of Attorney in Chart? Yes - validated most recent copy scanned in chart (See row information)    No - copy requested      Current Medications (verified) Outpatient Encounter Medications as of 11/01/2022  Medication Sig   atorvastatin (LIPITOR) 20 MG tablet TAKE ONE TABLET BY MOUTH DAILY   fluticasone (FLONASE) 50 MCG/ACT nasal spray Place 1 spray into both nostrils daily.   Glucosamine-Chondroit-Vit C-Mn (GLUCOSAMINE 1500 COMPLEX PO) Take 1 tablet by mouth daily.   latanoprost (XALATAN) 0.005 % ophthalmic solution 1 drop at bedtime.   Melatonin 1 MG TABS Take 1 tablet by mouth  at bedtime.   omeprazole (PRILOSEC) 20 MG capsule TAKE ONE CAPSULE BY MOUTH DAILY. PLEASE CALL DOCTOR FOR APPOINTMENT   sildenafil (REVATIO) 20 MG tablet Take 1-3 tablets (20-60 mg total) by mouth daily as needed. Take 1-3 tablets (20-60 mg) prior to anticipated intercourse   SYSTANE ULTRA 0.4-0.3 % SOLN Apply 1 drop to eye daily as needed.   timolol (TIMOPTIC) 0.5 % ophthalmic solution Place 1 drop into both eyes in the morning.   zolpidem (AMBIEN) 5 MG tablet TAKE ONE TABLET BY MOUTH EVERY NIGHT AT BEDTIME AS NEEDED FOR SLEEP   Dermatological Products, Misc. (DERMAREST ROSACEA EX) Apply topically. (Patient not taking: Reported on 03/30/2022)   [DISCONTINUED] zaleplon (SONATA) 10 MG capsule 10 mg. One tablet by mouth once a week    No facility-administered encounter medications on file as of 11/01/2022.    Allergies (verified) Patient has no known allergies.   History: Past Medical History:  Diagnosis Date   Adenomatous polyp    Allergy    Arthritis    RIGHT hand   Barrett's esophagus 08/03/2006   Cataract    GERD (gastroesophageal reflux disease)    on meds   Glaucoma    Hyperlipidemia    on meds   Internal hemorrhoids 08/03/2005   Past Surgical History:  Procedure Laterality Date   CATARACT EXTRACTION Bilateral    COLONOSCOPY  2015   MS-MAC-movi(good)-hems/SSP x 2  HEMORRHOID SURGERY     KNEE ARTHROSCOPY Left    UPPER GASTROINTESTINAL ENDOSCOPY  2015   barrett's esophagus   VASECTOMY     Family History  Problem Relation Age of Onset   Diabetes Father    Stroke Father    Colon cancer Neg Hx    Colon polyps Neg Hx    Esophageal cancer Neg Hx    Rectal cancer Neg Hx    Stomach cancer Neg Hx    Social History   Socioeconomic History   Marital status: Married    Spouse name: Not on file   Number of children: 2   Years of education: Not on file   Highest education level: Not on file  Occupational History   Occupation: Retired  Tobacco Use   Smoking status:  Former    Types: Pipe   Smokeless tobacco: Never   Tobacco comments:    pr stopped in 2020  Vaping Use   Vaping Use: Never used  Substance and Sexual Activity   Alcohol use: Yes    Alcohol/week: 14.0 standard drinks of alcohol    Types: 14 Glasses of wine per week    Comment: couple glasses of wine nightly   Drug use: No   Sexual activity: Yes  Other Topics Concern   Not on file  Social History Narrative   Not on file   Social Determinants of Health   Financial Resource Strain: Low Risk  (11/01/2022)   Overall Financial Resource Strain (CARDIA)    Difficulty of Paying Living Expenses: Not hard at all  Food Insecurity: No Food Insecurity (11/01/2022)   Hunger Vital Sign    Worried About Running Out of Food in the Last Year: Never true    Ran Out of Food in the Last Year: Never true  Transportation Needs: No Transportation Needs (11/01/2022)   PRAPARE - Administrator, Civil Service (Medical): No    Lack of Transportation (Non-Medical): No  Physical Activity: Sufficiently Active (11/01/2022)   Exercise Vital Sign    Days of Exercise per Week: 4 days    Minutes of Exercise per Session: 70 min  Stress: No Stress Concern Present (11/01/2022)   [employer] of Occupational Health - Occupational Stress Questionnaire    Feeling of Stress : Not at all  Social Connections: Socially Integrated (11/01/2022)   Social Connection and Isolation Panel [NHANES]    Frequency of Communication with Friends and Family: More than three times a week    Frequency of Social Gatherings with Friends and Family: More than three times a week    Attends Religious Services: More than 4 times per year    Active Member of [employer] or Organizations: Yes    Attends Engineer, structural: More than 4 times per year    Marital Status: Married    Tobacco Counseling Counseling given: Not Answered Tobacco comments: pr stopped in 2020   Clinical Intake:  Pre-visit preparation completed:  Yes  Pain : No/denies pain     Nutritional Risks: None Diabetes: No  How often do you need to have someone help you when you read instructions, pamphlets, or other written materials from your doctor or pharmacy?: 1 - Never  Diabetic?  no  Interpreter Needed?: No  Information entered by :: Remi Haggard LPN   Activities of Daily Living    11/01/2022    8:09 AM 11/01/2022    7:39 AM  In your present state of health, do you have  any difficulty performing the following activities:  Hearing? 0 0  Vision? 0 0  Difficulty concentrating or making decisions? 0 0  Walking or climbing stairs? 0 0  Dressing or bathing? 0 0  Doing errands, shopping? 0 0  Preparing Food and eating ? N N  Using the Toilet? N N  In the past six months, have you accidently leaked urine? N N  Do you have problems with loss of bowel control? N N  Managing your Medications? N N  Managing your Finances? N N  Housekeeping or managing your Housekeeping? N N    Patient Care Team: Shade Flood, MD as PCP - General (Family Medicine) Wynona Canes, MD as Referring Physician (Specialist) Charlsie Merles Kirstie Peri, DPM as Consulting Physician (Podiatry) Meryl Dare, MD as Consulting Physician (Gastroenterology)  Indicate any recent Medical Services you may have received from other than [facility] providers in the past year (date may be approximate).     Assessment:   This is a routine wellness examination for [employer].  Hearing/Vision screen Hearing Screening - Comments:: No trouble hearing Vision Screening - Comments:: Up to date Antony Contras  Dietary issues and exercise activities discussed: Current Exercise Habits: Home exercise routine, Type of exercise: walking (pickle ball, tennis, golf 3 x times a week), Time (Minutes): > 60, Frequency (Times/Week): 3, Weekly Exercise (Minutes/Week): 0   Goals Addressed             This Visit's Progress    Patient Stated       Maintain current lifestyle        Depression Screen    11/01/2022    8:14 AM 03/30/2022    8:01 AM 03/14/2022    8:53 AM 11/21/2021    9:07 AM 03/28/2021    8:02 AM 11/26/2020   10:12 AM 03/26/2020    9:02 AM  PHQ 2/9 Scores  PHQ - 2 Score 0 0 0 0 0 0 0  PHQ- 9 Score 2 0  1 0 0 1    Fall Risk    11/01/2022    8:07 AM 11/01/2022    7:39 AM 03/30/2022    8:01 AM 03/14/2022    8:53 AM 03/10/2022    3:38 PM  Fall Risk   Falls in the past year? 0 0 0 0 0  Number falls in past yr: 0 0 0 0 0  Injury with Fall? 0 0 0 0 0  Risk for fall due to :   No Fall Risks No Fall Risks   Follow up Falls evaluation completed;Education provided;Falls prevention discussed  Falls evaluation completed Falls evaluation completed     FALL RISK PREVENTION PERTAINING TO THE HOME:  Any stairs in or around the home? Yes  If so, are there any without handrails? No  Home free of loose throw rugs in walkways, pet beds, electrical cords, etc? Yes  Adequate lighting in your home to reduce risk of falls? Yes   ASSISTIVE DEVICES UTILIZED TO PREVENT FALLS:  Life alert? No  Use of a cane, walker or w/c? No  Grab bars in the bathroom? Yes  Shower chair or bench in shower? Yes  Elevated toilet seat or a handicapped toilet? Yes   TIMED UP AND GO:  Was the test performed? No .    Cognitive Function:        11/01/2022    8:12 AM 03/30/2022    8:05 AM 03/14/2022    8:32 AM 03/28/2021  8:38 AM 03/26/2020    8:58 AM  6CIT Screen  What Year? 0 points 0 points 0 points 0 points 0 points  What month? 0 points 0 points 0 points 0 points 0 points  What time? 0 points 0 points 0 points 0 points 0 points  Count back from 20 0 points 0 points 0 points 0 points 0 points  Months in reverse 0 points 0 points 0 points 0 points 0 points  Repeat phrase 0 points 0 points 0 points 0 points 0 points  Total Score 0 points 0 points 0 points 0 points 0 points    Immunizations Immunization History  Administered Date(s) Administered   Fluad Quad(high Dose 65+)  03/27/2019, 03/26/2020, 03/28/2021, 03/30/2022   Hepatitis A 02/24/1997, 02/18/2004   Hepatitis A, Adult 07/28/2004   Influenza, High Dose Seasonal PF 03/07/2018   Influenza,inj,Quad PF,6+ Mos 02/24/2016, 03/01/2017   Influenza-Unspecified 03/28/2014   MMR 02/18/2004   PFIZER(Purple Top)SARS-COV-2 Vaccination 07/24/2019, 08/14/2019, 03/26/2020   Pneumococcal Conjugate-13 01/19/2014   Pneumococcal Polysaccharide-23 08/10/2010   Td 07/03/2002   Tdap 01/19/2014   Zoster Recombinat (Shingrix) 03/28/2019   Zoster, Unspecified 04/20/2021    TDAP status: Up to date  Flu Vaccine status: Up to date  Pneumococcal vaccine status: Up to date  Covid-19 vaccine status: Information provided on how to obtain vaccines.   Qualifies for Shingles Vaccine? No   Zostavax completed Yes   Shingrix Completed?: Yes  Screening Tests Health Maintenance  Topic Date Due   COVID-19 Vaccine (4 - 2023-24 season) 11/17/2022 (Originally 03/03/2022)   INFLUENZA VACCINE  02/01/2023   Medicare Annual Wellness (AWV)  11/01/2023   DTaP/Tdap/Td (3 - Td or Tdap) 01/20/2024   COLONOSCOPY (Pts 45-32yrs Insurance coverage will need to be confirmed)  03/08/2025   Pneumonia Vaccine 70+ Years old  Completed   Hepatitis C Screening  Completed   Zoster Vaccines- Shingrix  Completed   HPV VACCINES  Aged Out    Health Maintenance  There are no preventive care reminders to display for this patient.   Colorectal cancer screening: Type of screening: Colonoscopy. Completed 2023. Repeat every 3 years  Lung Cancer Screening: (Low Dose CT Chest recommended if Age 4-80 years, 30 pack-year currently smoking OR have quit w/in 15years.) does not qualify.   Lung Cancer Screening Referral:   Additional Screening:  Hepatitis C Screening: does not qualify; Completed 2018  Vision Screening: Recommended annual ophthalmology exams for early detection of glaucoma and other disorders of the eye. Is the patient up to date with  their annual eye exam?  Yes  Who is the provider or what is the name of the office in which the patient attends annual eye exams? [facility] If pt is not established with a provider, would they like to be referred to a provider to establish care? No .   Dental Screening: Recommended annual dental exams for proper oral hygiene  Community Resource Referral / Chronic Care Management: CRR required this visit?  No   CCM required this visit?  No      Plan:     I have personally reviewed and noted the following in the patient's chart:   Medical and social history Use of alcohol, tobacco or illicit drugs  Current medications and supplements including opioid prescriptions. Patient is not currently taking opioid prescriptions. Functional ability and status Nutritional status Physical activity Advanced directives List of other physicians Hospitalizations, surgeries, and ER visits in previous 12 months Vitals Screenings to include cognitive, depression,  and falls Referrals and appointments  In addition, I have reviewed and discussed with patient certain preventive protocols, quality metrics, and best practice recommendations. A written personalized care plan for preventive services as well as general preventive health recommendations were provided to patient.     Remi Haggard, LPN   07/08/1094   Nurse Notes: patient was seen in [place] urgent care for Tendonitis of shoulder was prescribed some medication and it has cleared up.

## 2022-11-08 ENCOUNTER — Other Ambulatory Visit: Payer: Self-pay | Admitting: Family Medicine

## 2022-11-08 ENCOUNTER — Other Ambulatory Visit: Payer: Self-pay

## 2022-11-08 DIAGNOSIS — G479 Sleep disorder, unspecified: Secondary | ICD-10-CM

## 2022-11-08 DIAGNOSIS — D225 Melanocytic nevi of trunk: Secondary | ICD-10-CM | POA: Diagnosis not present

## 2022-11-08 DIAGNOSIS — L814 Other melanin hyperpigmentation: Secondary | ICD-10-CM | POA: Diagnosis not present

## 2022-11-08 DIAGNOSIS — L821 Other seborrheic keratosis: Secondary | ICD-10-CM | POA: Diagnosis not present

## 2022-11-08 DIAGNOSIS — L570 Actinic keratosis: Secondary | ICD-10-CM | POA: Diagnosis not present

## 2022-11-08 NOTE — Telephone Encounter (Signed)
Patient is requesting a refill of the following medications: Requested Prescriptions   Pending Prescriptions Disp Refills   zolpidem (AMBIEN) 5 MG tablet 15 tablet 0    Sig: TAKE ONE TABLET BY MOUTH EVERY NIGHT AT BEDTIME AS NEEDED FOR SLEEP    Date of patient request: 11/08/2022 Last office visit: 03/30/2022 Date of last refill: 03/30/2022 Last refill amount: 15 Follow up time period per chart: 1 year  [place] and is requesting so itll help with the time change

## 2022-11-08 NOTE — Telephone Encounter (Signed)
Ambien 5 mg LOV: 03/30/22 Last Refill:03/30/22 Upcoming appt: 04/02/23

## 2022-11-09 MED ORDER — ZOLPIDEM TARTRATE 5 MG PO TABS: ORAL_TABLET | ORAL | 0 refills | Status: DC

## 2022-11-09 NOTE — Telephone Encounter (Signed)
Medication discussed at his physical in September 2023, intermittent dosing of Ambien.  Controlled substance database reviewed.  Last filled #15 on 03/30/2022, refill ordered.

## 2023-01-24 ENCOUNTER — Other Ambulatory Visit: Payer: Self-pay | Admitting: Family Medicine

## 2023-01-24 DIAGNOSIS — E785 Hyperlipidemia, unspecified: Secondary | ICD-10-CM

## 2023-02-15 ENCOUNTER — Encounter (INDEPENDENT_AMBULATORY_CARE_PROVIDER_SITE_OTHER): Payer: Self-pay

## 2023-02-15 DIAGNOSIS — Z961 Presence of intraocular lens: Secondary | ICD-10-CM | POA: Diagnosis not present

## 2023-02-15 DIAGNOSIS — H401121 Primary open-angle glaucoma, left eye, mild stage: Secondary | ICD-10-CM | POA: Diagnosis not present

## 2023-02-15 DIAGNOSIS — H52203 Unspecified astigmatism, bilateral: Secondary | ICD-10-CM | POA: Diagnosis not present

## 2023-03-21 DIAGNOSIS — Z0101 Encounter for examination of eyes and vision with abnormal findings: Secondary | ICD-10-CM | POA: Diagnosis not present

## 2023-04-02 ENCOUNTER — Encounter: Payer: Self-pay | Admitting: Family Medicine

## 2023-04-02 ENCOUNTER — Ambulatory Visit (INDEPENDENT_AMBULATORY_CARE_PROVIDER_SITE_OTHER): Payer: Medicare HMO | Admitting: Family Medicine

## 2023-04-02 VITALS — BP 140/82 | HR 65 | Temp 98.6°F | Ht 69.0 in | Wt 200.4 lb

## 2023-04-02 DIAGNOSIS — Z23 Encounter for immunization: Secondary | ICD-10-CM | POA: Diagnosis not present

## 2023-04-02 DIAGNOSIS — Z125 Encounter for screening for malignant neoplasm of prostate: Secondary | ICD-10-CM | POA: Diagnosis not present

## 2023-04-02 DIAGNOSIS — E785 Hyperlipidemia, unspecified: Secondary | ICD-10-CM | POA: Diagnosis not present

## 2023-04-02 DIAGNOSIS — N528 Other male erectile dysfunction: Secondary | ICD-10-CM

## 2023-04-02 DIAGNOSIS — G479 Sleep disorder, unspecified: Secondary | ICD-10-CM | POA: Diagnosis not present

## 2023-04-02 DIAGNOSIS — K2270 Barrett's esophagus without dysplasia: Secondary | ICD-10-CM | POA: Diagnosis not present

## 2023-04-02 DIAGNOSIS — Z0000 Encounter for general adult medical examination without abnormal findings: Secondary | ICD-10-CM | POA: Diagnosis not present

## 2023-04-02 LAB — COMPREHENSIVE METABOLIC PANEL
ALT: 22 U/L (ref 0–53)
AST: 20 U/L (ref 0–37)
Albumin: 4.1 g/dL (ref 3.5–5.2)
Alkaline Phosphatase: 62 U/L (ref 39–117)
BUN: 11 mg/dL (ref 6–23)
CO2: 28 meq/L (ref 19–32)
Calcium: 9.3 mg/dL (ref 8.4–10.5)
Chloride: 101 meq/L (ref 96–112)
Creatinine, Ser: 0.75 mg/dL (ref 0.40–1.50)
GFR: 86.6 mL/min (ref >60.00)
Glucose, Bld: 101 mg/dL — ABNORMAL HIGH (ref 70–99)
Potassium: 4.3 meq/L (ref 3.5–5.1)
Sodium: 137 meq/L (ref 135–145)
Total Bilirubin: 1.2 mg/dL (ref 0.2–1.2)
Total Protein: 6.4 g/dL (ref 6.0–8.3)

## 2023-04-02 LAB — LIPID PANEL
Cholesterol: 192 mg/dL (ref 0–200)
HDL: 77.3 mg/dL (ref >39.00)
LDL Cholesterol: 93 mg/dL (ref 0–99)
NonHDL: 114.21
Total CHOL/HDL Ratio: 2
Triglycerides: 106 mg/dL (ref 0.0–149.0)
VLDL: 21.2 mg/dL (ref 0.0–40.0)

## 2023-04-02 LAB — PSA, MEDICARE: PSA: 0.64 ng/mL (ref 0.10–4.00)

## 2023-04-02 MED ORDER — ATORVASTATIN CALCIUM 20 MG PO TABS
20.0000 mg | ORAL_TABLET | Freq: Every day | ORAL | 1 refills | Status: DC
Start: 2023-04-02 — End: 2024-04-15

## 2023-04-02 MED ORDER — ZOLPIDEM TARTRATE 5 MG PO TABS
ORAL_TABLET | ORAL | 0 refills | Status: DC
Start: 2023-04-02 — End: 2023-08-22

## 2023-04-02 MED ORDER — OMEPRAZOLE 20 MG PO CPDR
20.0000 mg | DELAYED_RELEASE_CAPSULE | Freq: Every day | ORAL | 3 refills | Status: DC
Start: 2023-04-02 — End: 2024-04-15

## 2023-04-02 MED ORDER — SILDENAFIL CITRATE 20 MG PO TABS
20.0000 mg | ORAL_TABLET | Freq: Every day | ORAL | 5 refills | Status: DC | PRN
Start: 2023-04-02 — End: 2024-04-15

## 2023-04-02 NOTE — Progress Notes (Signed)
Subjective:  Patient ID: Stephen Conway, male    DOB: May 25, 1946  Age: 77 y.o. MRN: [id number]  CC:  Chief Complaint  Patient presents with   Annual Exam    HPI Stephen Conway presents for Annual Exam - no acute concerns.  PCP, me Dermatology, Dr. Wallace Cullens, Dr. Jeannine Boga, with prior squamous cell of face.  Appt 2x/year.  Gastroenterology, Dr. Russella Dar, colonoscopy last year.  Podiatry Dr. Charlsie Merles Ophthalmology Dr. Randon Goldsmith, with history of glaucoma and dry eyes  [place].   Hyperlipidemia: Treated with Lipitor 20 mg daily. No  new side effects/myalgias.  Lab Results  Component Value Date   CHOL 172 03/30/2022   HDL 63.30 03/30/2022   LDLCALC 89 03/30/2022   TRIG 95.0 03/30/2022   CHOLHDL 3 03/30/2022   Lab Results  Component Value Date   ALT 19 03/30/2022   AST 17 03/30/2022   ALKPHOS 69 03/30/2022   BILITOT 0.7 03/30/2022    History of insomnia Chronic insomnia, merrily with travel overseas.  Melatonin has been used, rarely uses Ambien - about once per month.   Denies parasomnias or side effects with Ambien.  Controlled substance database reviewed.  #15 filled on 11/09/2022.  Erectile dysfunction Treated with sildenafil, 20 mg, up to 3 to time which has been effective, no headache, flushing chest pain with exertion or hearing/vision changes reported.       04/02/2023    8:50 AM 11/01/2022    8:14 AM 03/30/2022    8:01 AM 03/14/2022    8:53 AM 11/21/2021    9:07 AM  Depression screen PHQ 2/9  Decreased Interest 0 0 0 0 0  Down, Depressed, Hopeless 0 0 0 0 0  PHQ - 2 Score 0 0 0 0 0  Altered sleeping 1 2 0  1  Tired, decreased energy 0 0 0  0  Change in appetite 0 0 0  0  Feeling bad or failure about yourself  0 0 0  0  Trouble concentrating 0 0 0  0  Moving slowly or fidgety/restless 0 0 0  0  Suicidal thoughts 0 0 0  0  PHQ-9 Score 1 2 0  1  Difficult doing work/chores Not difficult at all Not difficult at all       Health Maintenance  Topic Date Due    INFLUENZA VACCINE  02/01/2023   COVID-19 Vaccine (4 - 2023-24 season) 04/18/2023 (Originally 03/04/2023)   Medicare Annual Wellness (AWV)  11/01/2023   DTaP/Tdap/Td (3 - Td or Tdap) 01/20/2024   Colonoscopy  03/08/2025   Pneumonia Vaccine 31+ Years old  Completed   Hepatitis C Screening  Completed   Zoster Vaccines- Shingrix  Completed   HPV VACCINES  Aged Out  Colonoscopy September 2023 with repeat in 3 years.  History of polyps. Prostate: does not have family history of prostate cancer The natural history of prostate cancer and ongoing controversy regarding screening and potential treatment outcomes of prostate cancer has been discussed with the patient. The meaning of a false positive PSA and a false negative PSA has been discussed, and risks of procedures after age 49 also discussed. he indicates understanding of the limitations of this screening test and wishes to proceed with screening PSA testing.  Lab Results  Component Value Date   PSA1 0.5 03/26/2020   PSA1 0.5 03/27/2019   PSA1 0.6 03/07/2018   PSA 0.42 03/30/2022   PSA 0.46 03/28/2021   PSA 0.5 02/24/2016   Omeprazole  working well for heartburn. Daily dosing -  history of Barrett's.   Immunization History  Administered Date(s) Administered   Fluad Quad(high Dose 65+) 03/27/2019, 03/26/2020, 03/28/2021, 03/30/2022   Hepatitis A 02/24/1997, 02/18/2004   Hepatitis A, Adult 07/28/2004   Influenza, High Dose Seasonal PF 03/07/2018   Influenza,inj,Quad PF,6+ Mos 02/24/2016, 03/01/2017   Influenza-Unspecified 03/28/2014   MMR 02/18/2004   PFIZER(Purple Top)SARS-COV-2 Vaccination 07/24/2019, 08/14/2019, 03/26/2020   Pneumococcal Conjugate-13 01/19/2014   Pneumococcal Polysaccharide-23 08/10/2010   Td 07/03/2002   Tdap 01/19/2014   Zoster Recombinant(Shingrix) 03/28/2019   Zoster, Unspecified 04/20/2021  Shingrix was completed at pharmacy. Flu vaccine today COVID booster recommended - still deciding, likely will defer.   RSV vaccine, declines.  Vision Screening   Right eye Left eye Both eyes  Without correction     With correction 20/30 20/15   Optho - Dr. Randon Goldsmith as above, recent exam.   Dental: Every 6 to 9 months. Appt this week.   Alcohol: Up to 2 drinks per day,few days per week.  Tobacco: None  Exercise: Daily exercise with walking, golf, tennis, pickleball.   Has healthcare power of attorney and a living will. No changes at this time.    History Patient Active Problem List   Diagnosis Date Noted   Rosacea 02/24/2016   Hyperlipidemia 02/24/2016   Erectile dysfunction 02/03/2013   Lipids abnormal 09/25/2011   Personal history of colonic polyps 09/26/2010   Barrett's esophagus 09/26/2010   Past Medical History:  Diagnosis Date   Adenomatous polyp    Allergy    Arthritis    RIGHT hand   Barrett's esophagus 08/03/2006   Cataract    GERD (gastroesophageal reflux disease)    on meds   Glaucoma    Hyperlipidemia    on meds   Internal hemorrhoids 08/03/2005   Past Surgical History:  Procedure Laterality Date   CATARACT EXTRACTION Bilateral    COLONOSCOPY  2015   MS-MAC-movi(good)-hems/SSP x 2   HEMORRHOID SURGERY     KNEE ARTHROSCOPY Left    UPPER GASTROINTESTINAL ENDOSCOPY  2015   barrett's esophagus   VASECTOMY     No Known Allergies Prior to Admission medications   Medication Sig Start Date End Date Taking? Authorizing Provider  atorvastatin (LIPITOR) 20 MG tablet TAKE 1 TABLET BY MOUTH DAILY 01/24/23  Yes Shade Flood, MD  Dermatological Products, Misc. (DERMAREST ROSACEA EX) Apply topically.   Yes [provider]  fluticasone (FLONASE) 50 MCG/ACT nasal spray Place 1 spray into both nostrils daily. 03/26/20  Yes Peyton Najjar, MD  Glucosamine-Chondroit-Vit C-Mn (GLUCOSAMINE 1500 COMPLEX PO) Take 1 tablet by mouth daily.   Yes [provider]  latanoprost (XALATAN) 0.005 % ophthalmic solution 1 drop at bedtime.   Yes [provider]   Melatonin 1 MG TABS Take 1 tablet by mouth at bedtime.   Yes [provider]  omeprazole (PRILOSEC) 20 MG capsule TAKE ONE CAPSULE BY MOUTH DAILY. PLEASE CALL DOCTOR FOR APPOINTMENT 04/19/22  Yes Shade Flood, MD  sildenafil (REVATIO) 20 MG tablet Take 1-3 tablets (20-60 mg total) by mouth daily as needed. Take 1-3 tablets (20-60 mg) prior to anticipated intercourse 03/30/22  Yes Shade Flood, MD  SYSTANE ULTRA 0.4-0.3 % SOLN Apply 1 drop to eye daily as needed. 10/21/21  Yes [provider]  timolol (TIMOPTIC) 0.5 % ophthalmic solution Place 1 drop into both eyes in the morning. 01/24/22  Yes [provider]  zolpidem (AMBIEN) 5 MG tablet  TAKE ONE TABLET BY MOUTH EVERY NIGHT AT BEDTIME AS NEEDED FOR SLEEP 11/09/22  Yes Shade Flood, MD  zaleplon (SONATA) 10 MG capsule 10 mg. One tablet by mouth once a week   09/25/11  [provider]   Social History   Socioeconomic History   Marital status: Married    Spouse name: Not on file   Number of children: 2   Years of education: Not on file   Highest education level: Not on file  Occupational History   Occupation: Retired  Tobacco Use   Smoking status: Former    Types: Pipe   Smokeless tobacco: Never   Tobacco comments:    pr stopped in 2020  Vaping Use   Vaping status: Never Used  Substance and Sexual Activity   Alcohol use: Yes    Alcohol/week: 14.0 standard drinks of alcohol    Types: 14 Glasses of wine per week    Comment: couple glasses of wine nightly   Drug use: No   Sexual activity: Yes  Other Topics Concern   Not on file  Social History Narrative   Not on file   Social Determinants of Health   Financial Resource Strain: Low Risk  (04/02/2023)   Overall Financial Resource Strain (CARDIA)    Difficulty of Paying Living Expenses: Not hard at all  Food Insecurity: No Food Insecurity (04/02/2023)   Hunger Vital Sign    Worried About Running Out of Food in the Last Year: Never  true    Ran Out of Food in the Last Year: Never true  Transportation Needs: No Transportation Needs (04/02/2023)   PRAPARE - Administrator, Civil Service (Medical): No    Lack of Transportation (Non-Medical): No  Physical Activity: Sufficiently Active (04/02/2023)   Exercise Vital Sign    Days of Exercise per Week: 7 days    Minutes of Exercise per Session: 60 min  Stress: No Stress Concern Present (04/02/2023)   [employer] of Occupational Health - Occupational Stress Questionnaire    Feeling of Stress : Not at all  Social Connections: Socially Integrated (04/02/2023)   Social Connection and Isolation Panel [NHANES]    Frequency of Communication with Friends and Family: More than three times a week    Frequency of Social Gatherings with Friends and Family: More than three times a week    Attends Religious Services: More than 4 times per year    Active Member of [employer] or Organizations: Yes    Attends Banker Meetings: More than 4 times per year    Marital Status: Married  Catering manager Violence: Not At Risk (04/02/2023)   Humiliation, Afraid, Rape, and Kick questionnaire    Fear of Current or Ex-Partner: No    Emotionally Abused: No    Physically Abused: No    Sexually Abused: No    Review of Systems  13 point review of systems per patient health survey noted.  Negative other than as indicated above or in HPI.   Objective:   Vitals:   04/02/23 0856 04/02/23 0901  BP: (!) 140/80 (!) 140/82  Pulse: 65   Temp: 98.6 F (37 C)   TempSrc: Oral   SpO2: 99%   Weight: 200 lb 6.4 oz (90.9 kg)   Height: 5\' 9"  (1.753 m)    Plans home readings and advise me if remaining over 130/80. No current antihypertensives. Under 130/80 at home.   Physical Exam Vitals reviewed.  Constitutional:  Appearance: He is well-developed.  HENT:     Head: Normocephalic and atraumatic.     Right Ear: External ear normal.     Left Ear: External ear normal.  Eyes:      Conjunctiva/sclera: Conjunctivae normal.     Pupils: Pupils are equal, round, and reactive to light.  Neck:     Thyroid: No thyromegaly.  Cardiovascular:     Rate and Rhythm: Normal rate and regular rhythm.     Heart sounds: Normal heart sounds.  Pulmonary:     Effort: Pulmonary effort is normal. No respiratory distress.     Breath sounds: Normal breath sounds. No wheezing.  Abdominal:     General: There is no distension.     Palpations: Abdomen is soft.     Tenderness: There is no abdominal tenderness.  Musculoskeletal:        General: No tenderness. Normal range of motion.     Cervical back: Normal range of motion and neck supple.  Lymphadenopathy:     Cervical: No cervical adenopathy.  Skin:    General: Skin is warm and dry.  Neurological:     Mental Status: He is alert and oriented to person, place, and time.     Deep Tendon Reflexes: Reflexes are normal and symmetric.  Psychiatric:        Behavior: Behavior normal.        Assessment & Plan:  Stephen Conway is a 77 y.o. male . Annual physical exam  - -anticipatory guidance as below in AVS, screening labs above. Health maintenance items as above in HPI discussed/recommended as applicable.   Hyperlipidemia, unspecified hyperlipidemia type - Plan: atorvastatin (LIPITOR) 20 MG tablet, Lipid panel, Comprehensive metabolic panel, Lipid panel  -  Stable, tolerating current regimen. Medications refilled. Labs pending as above.   Barrett's esophagus without dysplasia - Plan: omeprazole (PRILOSEC) 20 MG capsule  - continue PPI.  Other male erectile dysfunction - Plan: sildenafil (REVATIO) 20 MG tablet  -sildenafil Rx given - use lowest effective dose. Side effects discussed (including but not limited to headache/flushing, blue discoloration of vision, possible vascular steal and risk of cardiac effects if underlying unknown coronary artery disease, and permanent sensorineural hearing loss). Understanding  expressed.  Sleep disturbance - Plan: zolpidem (AMBIEN) 5 MG tablet  - rare need for Ambien, refilled. Potential risks discussed.   Need for immunization against influenza - Plan: Flu Vaccine Trivalent High Dose (Fluad)  Screening for malignant neoplasm of prostate - Plan: PSA, Medicare ( [place] only)  BP home monitoring discussed with RTC precautions if elevated.   Meds ordered this encounter  Medications   atorvastatin (LIPITOR) 20 MG tablet    Sig: Take 1 tablet (20 mg total) by mouth daily.    Dispense:  90 tablet    Refill:  1   omeprazole (PRILOSEC) 20 MG capsule    Sig: Take 1 capsule (20 mg total) by mouth daily.    Dispense:  90 capsule    Refill:  3   sildenafil (REVATIO) 20 MG tablet    Sig: Take 1-3 tablets (20-60 mg total) by mouth daily as needed. Take 1-3 tablets (20-60 mg) prior to anticipated intercourse    Dispense:  20 tablet    Refill:  5   zolpidem (AMBIEN) 5 MG tablet    Sig: TAKE ONE TABLET BY MOUTH EVERY NIGHT AT BEDTIME AS NEEDED FOR SLEEP    Dispense:  15 tablet    Refill:  0  Patient Instructions  Thank you for coming in today. I will check labs, but no medication changes for now.  Blood pressure is borderline elevated.  Continue to monitor that at home and if it is running above 130/80 consistently follow-up to discuss further.  See handout on some common foods that are higher in sodium. Let me know if there are questions and thanks for coming in today.   Preventive Care 62 Years and Older, Male Preventive care refers to lifestyle choices and visits with your health care provider that can promote health and wellness. Preventive care visits are also called wellness exams. What can I expect for my preventive care visit? Counseling During your preventive care visit, your health care provider may ask about your: Medical history, including: Past medical problems. Family medical history. History of falls. Current health,  including: Emotional well-being. Home life and relationship well-being. Sexual activity. Memory and ability to understand (cognition). Lifestyle, including: Alcohol, nicotine or tobacco, and drug use. Access to firearms. Diet, exercise, and sleep habits. Work and work Astronomer. Sunscreen use. Safety issues such as seatbelt and bike helmet use. Physical exam Your health care provider will check your: Height and weight. These may be used to calculate your BMI (body mass index). BMI is a measurement that tells if you are at a healthy weight. Waist circumference. This measures the distance around your waistline. This measurement also tells if you are at a healthy weight and may help predict your risk of certain diseases, such as type 2 diabetes and high blood pressure. Heart rate and blood pressure. Body temperature. Skin for abnormal spots. What immunizations do I need?  Vaccines are usually given at various ages, according to a schedule. Your health care provider will recommend vaccines for you based on your age, medical history, and lifestyle or other factors, such as travel or where you work. What tests do I need? Screening Your health care provider may recommend screening tests for certain conditions. This may include: Lipid and cholesterol levels. Diabetes screening. This is done by checking your blood sugar (glucose) after you have not eaten for a while (fasting). Hepatitis C test. Hepatitis B test. HIV (human immunodeficiency virus) test. STI (sexually transmitted infection) testing, if you are at risk. Lung cancer screening. Colorectal cancer screening. Prostate cancer screening. Abdominal aortic aneurysm (AAA) screening. You may need this if you are a current or former smoker. Talk with your health care provider about your test results, treatment options, and if necessary, the need for more tests. Follow these instructions at home: Eating and drinking  Eat a diet that  includes fresh fruits and vegetables, whole grains, lean protein, and low-fat dairy products. Limit your intake of foods with high amounts of sugar, saturated fats, and salt. Take vitamin and mineral supplements as recommended by your health care provider. Do not drink alcohol if your health care provider tells you not to drink. If you drink alcohol: Limit how much you have to 0-2 drinks a day. Know how much alcohol is in your drink. In the [place] one drink equals one 12 oz bottle of beer (355 mL), one 5 oz glass of wine (148 mL), or one 1 oz glass of hard liquor (44 mL). Lifestyle Brush your teeth every morning and night with fluoride toothpaste. Floss one time each day. Exercise for at least 30 minutes 5 or more days each week. Do not use any products that contain nicotine or tobacco. These products include cigarettes, chewing tobacco, and vaping devices, such  as e-cigarettes. If you need help quitting, ask your health care provider. Do not use drugs. If you are sexually active, practice safe sex. Use a condom or other form of protection to prevent STIs. Take aspirin only as told by your health care provider. Make sure that you understand how much to take and what form to take. Work with your health care provider to find out whether it is safe and beneficial for you to take aspirin daily. Ask your health care provider if you need to take a cholesterol-lowering medicine (statin). Find healthy ways to manage stress, such as: Meditation, yoga, or listening to music. Journaling. Talking to a trusted person. Spending time with friends and family. Safety Always wear your seat belt while driving or riding in a vehicle. Do not drive: If you have been drinking alcohol. Do not ride with someone who has been drinking. When you are tired or distracted. While texting. If you have been using any mind-altering substances or drugs. Wear a helmet and other protective equipment during sports  activities. If you have firearms in your house, make sure you follow all gun safety procedures. Minimize exposure to UV radiation to reduce your risk of skin cancer. What's next? Visit your health care provider once a year for an annual wellness visit. Ask your health care provider how often you should have your eyes and teeth checked. Stay up to date on all vaccines. This information is not intended to replace advice given to you by your health care provider. Make sure you discuss any questions you have with your health care provider. Document Revised: 12/15/2020 Document Reviewed: 12/15/2020 Elsevier Patient Education  2024 Elsevier Inc.   Signed,   Meredith Staggers, MD [facility], [facility] 04/02/23 9:21 AM

## 2023-04-02 NOTE — Patient Instructions (Addendum)
Thank you for coming in today. I will check labs, but no medication changes for now.  Blood pressure is borderline elevated.  Continue to monitor that at home and if it is running above 130/80 consistently follow-up to discuss further.  See handout on some common foods that are higher in sodium. Let me know if there are questions and thanks for coming in today.   Preventive Care 65 Years and Older, Male Preventive care refers to lifestyle choices and visits with your health care provider that can promote health and wellness. Preventive care visits are also called wellness exams. What can I expect for my preventive care visit? Counseling During your preventive care visit, your health care provider may ask about your: Medical history, including: Past medical problems. Family medical history. History of falls. Current health, including: Emotional well-being. Home life and relationship well-being. Sexual activity. Memory and ability to understand (cognition). Lifestyle, including: Alcohol, nicotine or tobacco, and drug use. Access to firearms. Diet, exercise, and sleep habits. Work and work Astronomer. Sunscreen use. Safety issues such as seatbelt and bike helmet use. Physical exam Your health care provider will check your: Height and weight. These may be used to calculate your BMI (body mass index). BMI is a measurement that tells if you are at a healthy weight. Waist circumference. This measures the distance around your waistline. This measurement also tells if you are at a healthy weight and may help predict your risk of certain diseases, such as type 2 diabetes and high blood pressure. Heart rate and blood pressure. Body temperature. Skin for abnormal spots. What immunizations do I need?  Vaccines are usually given at various ages, according to a schedule. Your health care provider will recommend vaccines for you based on your age, medical history, and lifestyle or other factors,  such as travel or where you work. What tests do I need? Screening Your health care provider may recommend screening tests for certain conditions. This may include: Lipid and cholesterol levels. Diabetes screening. This is done by checking your blood sugar (glucose) after you have not eaten for a while (fasting). Hepatitis C test. Hepatitis B test. HIV (human immunodeficiency virus) test. STI (sexually transmitted infection) testing, if you are at risk. Lung cancer screening. Colorectal cancer screening. Prostate cancer screening. Abdominal aortic aneurysm (AAA) screening. You may need this if you are a current or former smoker. Talk with your health care provider about your test results, treatment options, and if necessary, the need for more tests. Follow these instructions at home: Eating and drinking  Eat a diet that includes fresh fruits and vegetables, whole grains, lean protein, and low-fat dairy products. Limit your intake of foods with high amounts of sugar, saturated fats, and salt. Take vitamin and mineral supplements as recommended by your health care provider. Do not drink alcohol if your health care provider tells you not to drink. If you drink alcohol: Limit how much you have to 0-2 drinks a day. Know how much alcohol is in your drink. In the [place] one drink equals one 12 oz bottle of beer (355 mL), one 5 oz glass of wine (148 mL), or one 1 oz glass of hard liquor (44 mL). Lifestyle Brush your teeth every morning and night with fluoride toothpaste. Floss one time each day. Exercise for at least 30 minutes 5 or more days each week. Do not use any products that contain nicotine or tobacco. These products include cigarettes, chewing tobacco, and vaping devices, such as e-cigarettes. If you  need help quitting, ask your health care provider. Do not use drugs. If you are sexually active, practice safe sex. Use a condom or other form of protection to prevent STIs. Take aspirin  only as told by your health care provider. Make sure that you understand how much to take and what form to take. Work with your health care provider to find out whether it is safe and beneficial for you to take aspirin daily. Ask your health care provider if you need to take a cholesterol-lowering medicine (statin). Find healthy ways to manage stress, such as: Meditation, yoga, or listening to music. Journaling. Talking to a trusted person. Spending time with friends and family. Safety Always wear your seat belt while driving or riding in a vehicle. Do not drive: If you have been drinking alcohol. Do not ride with someone who has been drinking. When you are tired or distracted. While texting. If you have been using any mind-altering substances or drugs. Wear a helmet and other protective equipment during sports activities. If you have firearms in your house, make sure you follow all gun safety procedures. Minimize exposure to UV radiation to reduce your risk of skin cancer. What's next? Visit your health care provider once a year for an annual wellness visit. Ask your health care provider how often you should have your eyes and teeth checked. Stay up to date on all vaccines. This information is not intended to replace advice given to you by your health care provider. Make sure you discuss any questions you have with your health care provider. Document Revised: 12/15/2020 Document Reviewed: 12/15/2020 Elsevier Patient Education  2024 [employer].

## 2023-04-22 ENCOUNTER — Other Ambulatory Visit: Payer: Self-pay | Admitting: Family Medicine

## 2023-04-22 DIAGNOSIS — K2270 Barrett's esophagus without dysplasia: Secondary | ICD-10-CM

## 2023-05-16 DIAGNOSIS — Z08 Encounter for follow-up examination after completed treatment for malignant neoplasm: Secondary | ICD-10-CM | POA: Diagnosis not present

## 2023-05-16 DIAGNOSIS — L728 Other follicular cysts of the skin and subcutaneous tissue: Secondary | ICD-10-CM | POA: Diagnosis not present

## 2023-05-16 DIAGNOSIS — L578 Other skin changes due to chronic exposure to nonionizing radiation: Secondary | ICD-10-CM | POA: Diagnosis not present

## 2023-05-16 DIAGNOSIS — Z86007 Personal history of in-situ neoplasm of skin: Secondary | ICD-10-CM | POA: Diagnosis not present

## 2023-05-16 DIAGNOSIS — Z85828 Personal history of other malignant neoplasm of skin: Secondary | ICD-10-CM | POA: Diagnosis not present

## 2023-05-16 DIAGNOSIS — L570 Actinic keratosis: Secondary | ICD-10-CM | POA: Diagnosis not present

## 2023-06-11 DIAGNOSIS — L570 Actinic keratosis: Secondary | ICD-10-CM | POA: Diagnosis not present

## 2023-07-09 DIAGNOSIS — Z0100 Encounter for examination of eyes and vision without abnormal findings: Secondary | ICD-10-CM | POA: Diagnosis not present

## 2023-07-09 DIAGNOSIS — L570 Actinic keratosis: Secondary | ICD-10-CM | POA: Diagnosis not present

## 2023-08-22 ENCOUNTER — Encounter: Payer: Self-pay | Admitting: Family Medicine

## 2023-08-22 ENCOUNTER — Telehealth (INDEPENDENT_AMBULATORY_CARE_PROVIDER_SITE_OTHER): Payer: Medicare HMO | Admitting: Family Medicine

## 2023-08-22 VITALS — BP 150/73 | Temp 98.0°F | Wt 197.0 lb

## 2023-08-22 DIAGNOSIS — J069 Acute upper respiratory infection, unspecified: Secondary | ICD-10-CM | POA: Diagnosis not present

## 2023-08-22 DIAGNOSIS — R051 Acute cough: Secondary | ICD-10-CM | POA: Diagnosis not present

## 2023-08-22 DIAGNOSIS — G479 Sleep disorder, unspecified: Secondary | ICD-10-CM

## 2023-08-22 MED ORDER — GUAIFENESIN-CODEINE 100-10 MG/5ML PO SOLN: 5.0000 mL | Freq: Three times a day (TID) | ORAL | 0 refills | Status: DC | PRN

## 2023-08-22 MED ORDER — BENZONATATE 100 MG PO CAPS: 100.0000 mg | ORAL_CAPSULE | Freq: Three times a day (TID) | ORAL | 0 refills | Status: DC | PRN

## 2023-08-22 MED ORDER — ZOLPIDEM TARTRATE 5 MG PO TABS: ORAL_TABLET | ORAL | 0 refills | Status: DC

## 2023-08-22 NOTE — Progress Notes (Signed)
Virtual Visit via Video Note  I connected with Stephen Conway on 08/22/23 at 8:04 AM by a video enabled telemedicine application and verified that I am speaking with the correct person using two identifiers.  Patient location: home, by self.  My location: office - [place].    I discussed the limitations, risks, security and privacy concerns of performing an evaluation and management service by telephone and the availability of in person appointments. I also discussed with the patient that there may be a patient responsible charge related to this service. The patient expressed understanding and agreed to proceed, consent obtained  Chief complaint:  Chief Complaint  Patient presents with   Cough    Congestion 98.5, negative for COVID yesterday, no nausea or vomiting notes a lot of fatigue, Sunday afternoon started with mild sxs.     History of Present Illness: Stephen Conway is a 78 y.o. male  Cough, congestion Went to dinner Friday night, multiple people felt sick few days later.  Sore thoat initially 3 days ago, head congestion and fatigue 2 days ago. Sinus headache. Some arm aches. Negative home covid testing yesterday.  Some cough, congestion at night.  No dyspnea or chest pain.  No confusion.  Tmax 99.   Tx: otc flu and cold, drinking fluids, water.   Flu vaccine 04/02/23.    Insomnia: Ambien as needed for travel overseas or [place]. 4 left - Controlled substance database (PDMP) reviewed. No concerns appreciated. Last filled #15 on 04/02/23.  Effective with usual half dosing, no new side effects, or parasomnias.  Request refill.    Patient Active Problem List   Diagnosis Date Noted   Rosacea 02/24/2016   Hyperlipidemia 02/24/2016   Erectile dysfunction 02/03/2013   Lipids abnormal 09/25/2011   History of colonic polyps 09/26/2010   Barrett's esophagus 09/26/2010   Past Medical History:  Diagnosis Date   Adenomatous polyp    Allergy    Arthritis     RIGHT hand   Barrett's esophagus 08/03/2006   Cataract    GERD (gastroesophageal reflux disease)    on meds   Glaucoma    Hyperlipidemia    on meds   Internal hemorrhoids 08/03/2005   Past Surgical History:  Procedure Laterality Date   CATARACT EXTRACTION Bilateral    COLONOSCOPY  2015   MS-MAC-movi(good)-hems/SSP x 2   HEMORRHOID SURGERY     KNEE ARTHROSCOPY Left    UPPER GASTROINTESTINAL ENDOSCOPY  2015   barrett's esophagus   VASECTOMY     No Known Allergies Prior to Admission medications   Medication Sig Start Date End Date Taking? Authorizing Provider  atorvastatin (LIPITOR) 20 MG tablet Take 1 tablet (20 mg total) by mouth daily. 04/02/23   Shade Flood, MD  Dermatological Products, Misc. (DERMAREST ROSACEA EX) Apply topically.    [provider]  fluticasone (FLONASE) 50 MCG/ACT nasal spray Place 1 spray into both nostrils daily. 03/26/20   Peyton Najjar, MD  Glucosamine-Chondroit-Vit C-Mn (GLUCOSAMINE 1500 COMPLEX PO) Take 1 tablet by mouth daily.    [provider]  latanoprost (XALATAN) 0.005 % ophthalmic solution 1 drop at bedtime.    [provider]  Melatonin 1 MG TABS Take 1 tablet by mouth at bedtime.    [provider]  omeprazole (PRILOSEC) 20 MG capsule Take 1 capsule (20 mg total) by mouth daily. 04/02/23   Shade Flood, MD  sildenafil (REVATIO) 20 MG tablet Take 1-3 tablets (20-60 mg total) by mouth daily  as needed. Take 1-3 tablets (20-60 mg) prior to anticipated intercourse 04/02/23   Shade Flood, MD  SYSTANE ULTRA 0.4-0.3 % SOLN Apply 1 drop to eye daily as needed. 10/21/21   [provider]  timolol (TIMOPTIC) 0.5 % ophthalmic solution Place 1 drop into both eyes in the morning. 01/24/22   [provider]  zolpidem (AMBIEN) 5 MG tablet TAKE ONE TABLET BY MOUTH EVERY NIGHT AT BEDTIME AS NEEDED FOR SLEEP 04/02/23   Shade Flood, MD  zaleplon (SONATA) 10 MG capsule 10 mg. One tablet by  mouth once a week   09/25/11  [provider]   Social History   Socioeconomic History   Marital status: Married    Spouse name: Not on file   Number of children: 2   Years of education: Not on file   Highest education level: Bachelor's degree (e.g., BA, AB, BS)  Occupational History   Occupation: Retired  Tobacco Use   Smoking status: Former    Types: Pipe   Smokeless tobacco: Never   Tobacco comments:    pr stopped in 2020  Vaping Use   Vaping status: Never Used  Substance and Sexual Activity   Alcohol use: Yes    Alcohol/week: 14.0 standard drinks of alcohol    Types: 14 Glasses of wine per week    Comment: couple glasses of wine nightly   Drug use: No   Sexual activity: Yes  Other Topics Concern   Not on file  Social History Narrative   Not on file   Social Drivers of Health   Financial Resource Strain: Low Risk  (08/21/2023)   Overall Financial Resource Strain (CARDIA)    Difficulty of Paying Living Expenses: Not hard at all  Food Insecurity: No Food Insecurity (08/21/2023)   Hunger Vital Sign    Worried About Running Out of Food in the Last Year: Never true    Ran Out of Food in the Last Year: Never true  Transportation Needs: No Transportation Needs (08/21/2023)   PRAPARE - Administrator, Civil Service (Medical): No    Lack of Transportation (Non-Medical): No  Physical Activity: Sufficiently Active (08/21/2023)   Exercise Vital Sign    Days of Exercise per Week: 7 days    Minutes of Exercise per Session: 60 min  Stress: No Stress Concern Present (08/21/2023)   [employer] of Occupational Health - Occupational Stress Questionnaire    Feeling of Stress : Not at all  Social Connections: Socially Integrated (08/21/2023)   Social Connection and Isolation Panel [NHANES]    Frequency of Communication with Friends and Family: More than three times a week    Frequency of Social Gatherings with Friends and Family: More than three times a week     Attends Religious Services: More than 4 times per year    Active Member of [employer] or Organizations: Yes    Attends Engineer, structural: More than 4 times per year    Marital Status: Married  Catering manager Violence: Not At Risk (04/02/2023)   Humiliation, Afraid, Rape, and Kick questionnaire    Fear of Current or Ex-Partner: No    Emotionally Abused: No    Physically Abused: No    Sexually Abused: No    Observations/Objective: Vitals:   08/22/23 0759  BP: (!) 150/73  Temp: 98 F (36.7 C)  TempSrc: Oral  Weight: 197 lb (89.4 kg)   Nontoxic appearance on video, speaking in full sentences, no respiratory  distress, no audible wheeze or stridor.  Few mild coughs during visit.  All questions answered with understanding of plan expressed    Assessment and Plan: Upper respiratory tract infection, unspecified type Acute cough - Plan: benzonatate (TESSALON) 100 MG capsule, guaiFENesin-codeine 100-10 MG/5ML syrup  -Flulike illness versus other viral URI.  Mild symptoms at this time.  Hydrated, no dyspnea negative COVID testing.  Possible flu but now approximately 3 to 4 days out from onset of symptoms.  Hold on antivirals.  Symptomatic care discussed, fluids, antitussives including Tessalon Perles and guaifenesin codeine if needed at bedtime, potential side effects discussed.  RTC/ER precautions.   Sleep disturbance - Plan: zolpidem (AMBIEN) 5 MG tablet  -Infrequent need for low-dose Ambien with travel.  No concerns, refilled.   Follow Up Instructions:  As needed with RTC precautions I discussed the assessment and treatment plan with the patient. The patient was provided an opportunity to ask questions and all were answered. The patient agreed with the plan and demonstrated an understanding of the instructions.   The patient was advised to call back or seek an in-person evaluation if the symptoms worsen or if the condition fails to improve as anticipated.   Shade Flood, MD

## 2023-08-22 NOTE — Patient Instructions (Signed)
Thanks for taking the video call today.  As we discussed I suspect you have a viral illness and possible mild case of influenza.  Continue to drink plenty of fluids, rest as able, over-the-counter Mucinex is fine if needed for cough but I also sent in some Tessalon Perles if needed throughout the day, codeine cough syrup if needed at night.  I expect your symptoms to be improving in the next few days, but if any worsening symptoms, or not improving into next week, schedule visit in person where we can perform exam and determine if other testing or change in treatment needed.  Ambien was refilled.  Take care and let me know if there are questions.  Upper Respiratory Infection, Adult An upper respiratory infection (URI) is a common viral infection of the nose, throat, and upper air passages that lead to the lungs. The most common type of URI is the common cold. URIs usually get better on their own, without medical treatment. What are the causes? A URI is caused by a virus. You may catch a virus by: Breathing in droplets from an infected person's cough or sneeze. Touching something that has been exposed to the virus (is contaminated) and then touching your mouth, nose, or eyes. What increases the risk? You are more likely to get a URI if: You are very young or very old. You have close contact with others, such as at work, school, or a health care facility. You smoke. You have long-term (chronic) heart or lung disease. You have a weakened disease-fighting system (immune system). You have nasal allergies or asthma. You are experiencing a lot of stress. You have poor nutrition. What are the signs or symptoms? A URI usually involves some of the following symptoms: Runny or stuffy (congested) nose. Cough. Sneezing. Sore throat. Headache. Fatigue. Fever. Loss of appetite. Pain in your forehead, behind your eyes, and over your cheekbones (sinus pain). Muscle aches. Redness or irritation of the  eyes. Pressure in the ears or face. How is this diagnosed? This condition may be diagnosed based on your medical history and symptoms, and a physical exam. Your health care provider may use a swab to take a mucus sample from your nose (nasal swab). This sample can be tested to determine what virus is causing the illness. How is this treated? URIs usually get better on their own within 7-10 days. Medicines cannot cure URIs, but your health care provider may recommend certain medicines to help relieve symptoms, such as: Over-the-counter cold medicines. Cough suppressants. Coughing is a type of defense against infection that helps to clear the respiratory system, so take these medicines only as recommended by your health care provider. Fever-reducing medicines. Follow these instructions at home: Activity Rest as needed. If you have a fever, stay home from work or school until your fever is gone or until your health care provider says your URI cannot spread to other people (is no longer contagious). Your health care provider may have you wear a face mask to prevent your infection from spreading. Relieving symptoms Gargle with a mixture of salt and water 3-4 times a day or as needed. To make salt water, completely dissolve -1 tsp (3-6 g) of salt in 1 cup (237 mL) of warm water. Use a cool-mist humidifier to add moisture to the air. This can help you breathe more easily. Eating and drinking  Drink enough fluid to keep your urine pale yellow. Eat soups and other clear broths. General instructions  Take over-the-counter and  prescription medicines only as told by your health care provider. These include cold medicines, fever reducers, and cough suppressants. Do not use any products that contain nicotine or tobacco. These products include cigarettes, chewing tobacco, and vaping devices, such as e-cigarettes. If you need help quitting, ask your health care provider. Stay away from secondhand  smoke. Stay up to date on all immunizations, including the yearly (annual) flu vaccine. Keep all follow-up visits. This is important. How to prevent the spread of infection to others URIs can be contagious. To prevent the infection from spreading: Wash your hands with soap and water for at least 20 seconds. If soap and water are not available, use hand sanitizer. Avoid touching your mouth, face, eyes, or nose. Cough or sneeze into a tissue or your sleeve or elbow instead of into your hand or into the air.  Contact a health care provider if: You are getting worse instead of better. You have a fever or chills. Your mucus is brown or red. You have yellow or brown discharge coming from your nose. You have pain in your face, especially when you bend forward. You have swollen neck glands. You have pain while swallowing. You have white areas in the back of your throat. Get help right away if: You have shortness of breath that gets worse. You have severe or persistent: Headache. Ear pain. Sinus pain. Chest pain. You have chronic lung disease along with any of the following: Making high-pitched whistling sounds when you breathe, most often when you breathe out (wheezing). Prolonged cough (more than 14 days). Coughing up blood. A change in your usual mucus. You have a stiff neck. You have changes in your: Vision. Hearing. Thinking. Mood. These symptoms may be an emergency. Get help right away. Call 911. Do not wait to see if the symptoms will go away. Do not drive yourself to the hospital. Summary An upper respiratory infection (URI) is a common infection of the nose, throat, and upper air passages that lead to the lungs. A URI is caused by a virus. URIs usually get better on their own within 7-10 days. Medicines cannot cure URIs, but your health care provider may recommend certain medicines to help relieve symptoms. This information is not intended to replace advice given to you by  your health care provider. Make sure you discuss any questions you have with your health care provider. Document Revised: 01/19/2021 Document Reviewed: 01/19/2021 Elsevier Patient Education  2024 [employer].

## 2023-09-03 DIAGNOSIS — H401131 Primary open-angle glaucoma, bilateral, mild stage: Secondary | ICD-10-CM | POA: Diagnosis not present

## 2023-10-10 DIAGNOSIS — Z08 Encounter for follow-up examination after completed treatment for malignant neoplasm: Secondary | ICD-10-CM | POA: Diagnosis not present

## 2023-10-10 DIAGNOSIS — Z86007 Personal history of in-situ neoplasm of skin: Secondary | ICD-10-CM | POA: Diagnosis not present

## 2023-10-10 DIAGNOSIS — L821 Other seborrheic keratosis: Secondary | ICD-10-CM | POA: Diagnosis not present

## 2023-10-10 DIAGNOSIS — L570 Actinic keratosis: Secondary | ICD-10-CM | POA: Diagnosis not present

## 2023-10-10 DIAGNOSIS — Z85828 Personal history of other malignant neoplasm of skin: Secondary | ICD-10-CM | POA: Diagnosis not present

## 2023-10-10 DIAGNOSIS — L728 Other follicular cysts of the skin and subcutaneous tissue: Secondary | ICD-10-CM | POA: Diagnosis not present

## 2023-10-10 DIAGNOSIS — D225 Melanocytic nevi of trunk: Secondary | ICD-10-CM | POA: Diagnosis not present

## 2023-10-10 DIAGNOSIS — L814 Other melanin hyperpigmentation: Secondary | ICD-10-CM | POA: Diagnosis not present

## 2024-01-30 ENCOUNTER — Encounter: Payer: Self-pay | Admitting: Student in an Organized Health Care Education/Training Program

## 2024-01-30 ENCOUNTER — Ambulatory Visit (INDEPENDENT_AMBULATORY_CARE_PROVIDER_SITE_OTHER): Admitting: Student in an Organized Health Care Education/Training Program

## 2024-01-30 VITALS — BP 174/115 | HR 64 | Wt 202.0 lb

## 2024-01-30 DIAGNOSIS — S39012A Strain of muscle, fascia and tendon of lower back, initial encounter: Secondary | ICD-10-CM | POA: Diagnosis not present

## 2024-01-30 DIAGNOSIS — G4725 Circadian rhythm sleep disorder, jet lag type: Secondary | ICD-10-CM | POA: Insufficient documentation

## 2024-01-30 DIAGNOSIS — G479 Sleep disorder, unspecified: Secondary | ICD-10-CM

## 2024-01-30 MED ORDER — ZOLPIDEM TARTRATE 5 MG PO TABS: ORAL_TABLET | ORAL | 0 refills | Status: DC

## 2024-01-30 NOTE — Patient Instructions (Signed)
  VISIT SUMMARY: Today, you were seen for right-sided flank pain that started after playing pickleball. The pain extends from your back to the front and is worsened by certain movements. You have been very active, but the pain has limited your activities recently.  YOUR PLAN: -RIGHT PARASPINAL MUSCLE STRAIN: You have a muscle strain in the muscles next to your spine, likely from physical activity. This type of strain occurs when the muscle fibers are overstretched or torn. To help with recovery, you should rest from golf and pickleball for at least two weeks. Gradually return to activity, starting with light exercises like range sessions and nine holes of golf. Apply Voltaren gel to the affected area four times a day and take ibuprofen, two tablets twice a day for three to four days. Avoid prolonged use of ibuprofen to prevent stomach issues. You can do low-impact exercises like walking, biking, or using an elliptical as long as they do not cause pain.  INSTRUCTIONS: Rest from golf and pickleball for at least two weeks. Gradually return to activity, starting with light exercises like range sessions and nine holes of golf. Apply Voltaren gel to the affected area four times a day. Take ibuprofen, two tablets twice a day for three to four days, but avoid prolonged use. Engage in low-impact exercises like walking, biking, or using an elliptical, provided they do not cause pain.

## 2024-01-30 NOTE — Progress Notes (Signed)
 Acute Office Visit  Subjective:     Patient ID: Stephen Conway, male    DOB: Apr 16, 1946, 78 y.o.   MRN: [id number]  Chief Complaint  Patient presents with   Back Pain    Lower bacnkand rib pain     HPI  Discussed the use of AI scribe software for clinical note transcription with the patient, who gave verbal consent to proceed.  History of Present Illness Stephen Conway is a 78 year old male who presents with right-sided flank pain.  He experiences right-sided flank pain extending from the back to the front, exacerbated by coughing, leg raising, and occasionally occurring without specific triggers. The pain began after playing pickleball following a week of physical activities, including golf and pickleball, without any specific injury. No radiation of pain down the leg, fevers, chills, or changes in bowel or bladder control.  He is physically active, engaging in golf, pickleball, and tennis six days a week. Since the onset of pain, he has reduced his activity, walking only three to four thousand steps daily compared to his usual seven to ten thousand. The pain limits his participation in golf and pickleball but does not significantly impact other daily activities. It is more noticeable later in the day and can occur even when sitting.  He denies recent falls, changes in skin condition, or rashes, although he has a history of rosacea and dry skin. He recalls a fall three weeks ago but does not associate it with his current pain. He has not undergone any surgeries on his back.      Objective:    BP (!) 174/115   Pulse 64   Wt 202 lb (91.6 kg)   BMI 29.83 kg/m   Physical Exam  Gen: Well-appearing man Skin: Skin of his back appears normal, there is no vesicular rash in the area that is uncomfortable in his flank MSK: He has point tenderness over the right paraspinal muscles.  No point tenderness over the ribs. Neuro: Full strength in the upper and lower  extremities.  Normal get up and go, normal balance.      Assessment & Plan:    Problem List Items Addressed This Visit       Unprioritized   Strain of lumbar paraspinal muscle - Primary   Acute strain likely from physical activity, with differential including muscle  strain or tendonitis. Rib fracture is unlikely, and there is no systemic involvement. Conservative management is recommended due to the risks associated with steroid use. Rest is crucial, with a gradual return to activity to prevent prolonged recovery or reinjury. He should rest from golf and pickleball for at least two weeks. Gradual return to activity is advised, starting with light activities such as range sessions and nine holes of golf. Use Voltaren gel topically four times a day on the affected area. Take ibuprofen, two tablets twice a day for three to four days, with caution to avoid prolonged use to prevent gastrointestinal issues. Engage in low-impact exercises like walking, biking, or using an elliptical, provided he does not cause pain.      Jet lag   Upcoming international travel.  History of jet lag impacting sleep.  He requested a refill of zolpidem .  I reviewed the database, last fill about 6 months ago for just 15 tablets.  No side effects.  Will refill another 15 tablets for his upcoming travels.      Other Visit Diagnoses       Sleep  disturbance       Relevant Medications   zolpidem  (AMBIEN ) 5 MG tablet       Meds ordered this encounter  Medications   zolpidem  (AMBIEN ) 5 MG tablet    Sig: 1 tablet by mouth at night as needed for jet lag    Dispense:  15 tablet    Refill:  0    Return if symptoms worsen or fail to improve.  Cleatus Debby Specking, MD

## 2024-01-30 NOTE — Assessment & Plan Note (Signed)
 Upcoming international travel.  History of jet lag impacting sleep.  He requested a refill of zolpidem .  I reviewed the database, last fill about 6 months ago for just 15 tablets.  No side effects.  Will refill another 15 tablets for his upcoming travels.

## 2024-01-30 NOTE — Assessment & Plan Note (Signed)
 Acute strain likely from physical activity, with differential including muscle  strain or tendonitis. Rib fracture is unlikely, and there is no systemic involvement. Conservative management is recommended due to the risks associated with steroid use. Rest is crucial, with a gradual return to activity to prevent prolonged recovery or reinjury. He should rest from golf and pickleball for at least two weeks. Gradual return to activity is advised, starting with light activities such as range sessions and nine holes of golf. Use Voltaren gel topically four times a day on the affected area. Take ibuprofen, two tablets twice a day for three to four days, with caution to avoid prolonged use to prevent gastrointestinal issues. Engage in low-impact exercises like walking, biking, or using an elliptical, provided he does not cause pain.

## 2024-03-12 DIAGNOSIS — Z961 Presence of intraocular lens: Secondary | ICD-10-CM | POA: Diagnosis not present

## 2024-03-12 DIAGNOSIS — H401131 Primary open-angle glaucoma, bilateral, mild stage: Secondary | ICD-10-CM | POA: Diagnosis not present

## 2024-04-01 ENCOUNTER — Telehealth: Payer: Self-pay

## 2024-04-01 DIAGNOSIS — Z125 Encounter for screening for malignant neoplasm of prostate: Secondary | ICD-10-CM

## 2024-04-01 DIAGNOSIS — E785 Hyperlipidemia, unspecified: Secondary | ICD-10-CM

## 2024-04-01 DIAGNOSIS — K219 Gastro-esophageal reflux disease without esophagitis: Secondary | ICD-10-CM

## 2024-04-01 NOTE — Telephone Encounter (Signed)
 Copied from CRM #[id number]. Topic: Clinical - Lab/Test Results >> Mar 31, 2024  5:11 PM Alfonso HERO wrote: Reason for CRM: Patient wants to know if lab work is needed for his upcoming visit and if so can he come in early that morning since he will have to fast.

## 2024-04-01 NOTE — Telephone Encounter (Signed)
 1 year physical appointment upcoming 04/14/2024. Okay to order physical lab work prior to appointment time? Happy to place orders if appropriate

## 2024-04-01 NOTE — Telephone Encounter (Signed)
 Sure, okay to order CMP, lipid panel with diagnosis hyperlipidemia, CBC, with diagnosis GERD. He did request PSA last year.  Typically after age 78 that is voluntary and will leave that up to him again.  If he would like to have that drawn again can also add PSA Medicare for prostate cancer screening.  Thank you.

## 2024-04-01 NOTE — Telephone Encounter (Signed)
 Called and made patient an appointment he does want PSA check, will order labs for future

## 2024-04-01 NOTE — Addendum Note (Signed)
 Addended by: Dianely Krehbiel K on: 04/01/2024 01:03 PM   Modules accepted: Orders

## 2024-04-04 ENCOUNTER — Encounter: Payer: Medicare HMO | Admitting: Family Medicine

## 2024-04-07 ENCOUNTER — Other Ambulatory Visit

## 2024-04-07 DIAGNOSIS — E785 Hyperlipidemia, unspecified: Secondary | ICD-10-CM

## 2024-04-07 DIAGNOSIS — Z125 Encounter for screening for malignant neoplasm of prostate: Secondary | ICD-10-CM

## 2024-04-07 DIAGNOSIS — K219 Gastro-esophageal reflux disease without esophagitis: Secondary | ICD-10-CM

## 2024-04-07 LAB — LIPID PANEL
Cholesterol: 186 mg/dL (ref 0–200)
HDL: 63.9 mg/dL (ref >39.00)
LDL Cholesterol: 88 mg/dL (ref 0–99)
NonHDL: 122.55
Total CHOL/HDL Ratio: 3
Triglycerides: 172 mg/dL — ABNORMAL HIGH (ref 0.0–149.0)
VLDL: 34.4 mg/dL (ref 0.0–40.0)

## 2024-04-07 LAB — CBC WITH DIFFERENTIAL/PLATELET
Basophils Absolute: 0 K/uL (ref 0.0–0.1)
Basophils Relative: 0.6 % (ref 0.0–3.0)
Eosinophils Absolute: 0.3 K/uL (ref 0.0–0.7)
Eosinophils Relative: 5.8 % — ABNORMAL HIGH (ref 0.0–5.0)
HCT: 43.5 % (ref 39.0–52.0)
Hemoglobin: 14.8 g/dL (ref 13.0–17.0)
Lymphocytes Relative: 24.4 % (ref 12.0–46.0)
Lymphs Abs: 1.1 K/uL (ref 0.7–4.0)
MCHC: 34 g/dL (ref 30.0–36.0)
MCV: 92.3 fl (ref 78.0–100.0)
Monocytes Absolute: 0.4 K/uL (ref 0.1–1.0)
Monocytes Relative: 8.7 % (ref 3.0–12.0)
Neutro Abs: 2.8 K/uL (ref 1.4–7.7)
Neutrophils Relative %: 60.5 % (ref 43.0–77.0)
Platelets: 207 K/uL (ref 150.0–400.0)
RBC: 4.71 Mil/uL (ref 4.22–5.81)
RDW: 12.7 % (ref 11.5–15.5)
WBC: 4.7 K/uL (ref 4.0–10.5)

## 2024-04-07 LAB — COMPREHENSIVE METABOLIC PANEL WITH GFR
ALT: 20 U/L (ref 0–53)
AST: 20 U/L (ref 0–37)
Albumin: 4.1 g/dL (ref 3.5–5.2)
Alkaline Phosphatase: 71 U/L (ref 39–117)
BUN: 11 mg/dL (ref 6–23)
CO2: 27 meq/L (ref 19–32)
Calcium: 9.3 mg/dL (ref 8.4–10.5)
Chloride: 102 meq/L (ref 96–112)
Creatinine, Ser: 0.7 mg/dL (ref 0.40–1.50)
GFR: 87.79 mL/min (ref >60.00)
Glucose, Bld: 115 mg/dL — ABNORMAL HIGH (ref 70–99)
Potassium: 4.3 meq/L (ref 3.5–5.1)
Sodium: 140 meq/L (ref 135–145)
Total Bilirubin: 1 mg/dL (ref 0.2–1.2)
Total Protein: 6.2 g/dL (ref 6.0–8.3)

## 2024-04-07 LAB — PSA, MEDICARE: PSA: 0.6 ng/mL (ref 0.10–4.00)

## 2024-04-12 ENCOUNTER — Ambulatory Visit: Payer: Self-pay | Admitting: Family Medicine

## 2024-04-14 ENCOUNTER — Ambulatory Visit (INDEPENDENT_AMBULATORY_CARE_PROVIDER_SITE_OTHER): Admitting: Family Medicine

## 2024-04-14 VITALS — BP 130/88 | HR 89 | Temp 98.1°F | Resp 16 | Ht 69.0 in | Wt 205.0 lb

## 2024-04-14 DIAGNOSIS — N528 Other male erectile dysfunction: Secondary | ICD-10-CM | POA: Diagnosis not present

## 2024-04-14 DIAGNOSIS — Z0000 Encounter for general adult medical examination without abnormal findings: Secondary | ICD-10-CM | POA: Diagnosis not present

## 2024-04-14 DIAGNOSIS — Z23 Encounter for immunization: Secondary | ICD-10-CM

## 2024-04-14 DIAGNOSIS — E785 Hyperlipidemia, unspecified: Secondary | ICD-10-CM

## 2024-04-14 DIAGNOSIS — L578 Other skin changes due to chronic exposure to nonionizing radiation: Secondary | ICD-10-CM | POA: Diagnosis not present

## 2024-04-14 DIAGNOSIS — L570 Actinic keratosis: Secondary | ICD-10-CM | POA: Diagnosis not present

## 2024-04-14 DIAGNOSIS — L720 Epidermal cyst: Secondary | ICD-10-CM | POA: Diagnosis not present

## 2024-04-14 DIAGNOSIS — G479 Sleep disorder, unspecified: Secondary | ICD-10-CM | POA: Diagnosis not present

## 2024-04-14 DIAGNOSIS — D0439 Carcinoma in situ of skin of other parts of face: Secondary | ICD-10-CM | POA: Diagnosis not present

## 2024-04-14 DIAGNOSIS — Z1211 Encounter for screening for malignant neoplasm of colon: Secondary | ICD-10-CM

## 2024-04-14 DIAGNOSIS — K219 Gastro-esophageal reflux disease without esophagitis: Secondary | ICD-10-CM

## 2024-04-14 DIAGNOSIS — C44329 Squamous cell carcinoma of skin of other parts of face: Secondary | ICD-10-CM | POA: Diagnosis not present

## 2024-04-14 DIAGNOSIS — K2270 Barrett's esophagus without dysplasia: Secondary | ICD-10-CM | POA: Diagnosis not present

## 2024-04-14 DIAGNOSIS — D492 Neoplasm of unspecified behavior of bone, soft tissue, and skin: Secondary | ICD-10-CM | POA: Diagnosis not present

## 2024-04-14 NOTE — Patient Instructions (Addendum)
 Thank you for coming in today. No change in medications at this time.  Take care!   Back Exercises The following exercises strengthen the muscles that help to support the trunk (torso) and back. They also help to keep the lower back flexible. Doing these exercises can help to prevent or lessen existing low back pain. If you have back pain or discomfort, try doing these exercises 2-3 times each day or as told by your health care provider. As your pain improves, do them once each day, but increase the number of times that you repeat the steps for each exercise (do more repetitions). To prevent the recurrence of back pain, continue to do these exercises once each day or as told by your health care provider. Do exercises exactly as told by your health care provider and adjust them as directed. It is normal to feel mild stretching, pulling, tightness, or discomfort as you do these exercises, but you should stop right away if you feel sudden pain or your pain gets worse. Exercises Single knee to chest Repeat these steps 3-5 times for each leg: Lie on your back on a firm bed or the floor with your legs extended. Bring one knee to your chest. Your other leg should stay extended and in contact with the floor. Hold your knee in place by grabbing your knee or thigh with both hands and hold. Pull on your knee until you feel a gentle stretch in your lower back or buttocks. Hold the stretch for 10-30 seconds. Slowly release and straighten your leg.  Pelvic tilt Repeat these steps 5-10 times: Lie on your back on a firm bed or the floor with your legs extended. Bend your knees so they are pointing toward the ceiling and your feet are flat on the floor. Tighten your lower abdominal muscles to press your lower back against the floor. This motion will tilt your pelvis so your tailbone points up toward the ceiling instead of pointing to your feet or the floor. With gentle tension and even breathing, hold this  position for 5-10 seconds.  Cat-cow Repeat these steps until your lower back becomes more flexible: Get into a hands-and-knees position on a firm bed or the floor. Keep your hands under your shoulders, and keep your knees under your hips. You may place padding under your knees for comfort. Let your head hang down toward your chest. Contract your abdominal muscles and point your tailbone toward the floor so your lower back becomes rounded like the back of a cat. Hold this position for 5 seconds. Slowly lift your head, let your abdominal muscles relax, and point your tailbone up toward the ceiling so your back forms a sagging arch like the back of a cow. Hold this position for 5 seconds.  Press-ups Repeat these steps 5-10 times: Lie on your abdomen (face-down) on a firm bed or the floor. Place your palms near your head, about shoulder-width apart. Keeping your back as relaxed as possible and keeping your hips on the floor, slowly straighten your arms to raise the top half of your body and lift your shoulders. Do not use your back muscles to raise your upper torso. You may adjust the placement of your hands to make yourself more comfortable. Hold this position for 5 seconds while you keep your back relaxed. Slowly return to lying flat on the floor.  Bridges Repeat these steps 10 times: Lie on your back on a firm bed or the floor. Bend your knees so they  are pointing toward the ceiling and your feet are flat on the floor. Your arms should be flat at your sides, next to your body. Tighten your buttocks muscles and lift your buttocks off the floor until your waist is at almost the same height as your knees. You should feel the muscles working in your buttocks and the back of your thighs. If you do not feel these muscles, slide your feet 1-2 inches (2.5-5 cm) farther away from your buttocks. Hold this position for 3-5 seconds. Slowly lower your hips to the starting position, and allow your buttocks  muscles to relax completely. If this exercise is too easy, try doing it with your arms crossed over your chest. Abdominal crunches Repeat these steps 5-10 times: Lie on your back on a firm bed or the floor with your legs extended. Bend your knees so they are pointing toward the ceiling and your feet are flat on the floor. Cross your arms over your chest. Tip your chin slightly toward your chest without bending your neck. Tighten your abdominal muscles and slowly raise your torso high enough to lift your shoulder blades a tiny bit off the floor. Avoid raising your torso higher than that because it can put too much stress on your lower back and does not help to strengthen your abdominal muscles. Slowly return to your starting position.  Back lifts Repeat these steps 5-10 times: Lie on your abdomen (face-down) with your arms at your sides, and rest your forehead on the floor. Tighten the muscles in your legs and your buttocks. Slowly lift your chest off the floor while you keep your hips pressed to the floor. Keep the back of your head in line with the curve in your back. Your eyes should be looking at the floor. Hold this position for 3-5 seconds. Slowly return to your starting position.  Contact a health care provider if: Your back pain or discomfort gets much worse when you do an exercise. Your worsening back pain or discomfort does not lessen within 2 hours after you exercise. If you have any of these problems, stop doing these exercises right away. Do not do them again unless your health care provider says that you can. Get help right away if: You develop sudden, severe back pain. If this happens, stop doing the exercises right away. Do not do them again unless your health care provider says that you can. This information is not intended to replace advice given to you by your health care provider. Make sure you discuss any questions you have with your health care provider. Document  Revised: 07/23/2022 Document Reviewed: 09/01/2020 [employer] Patient Education  2024 [employer].    Preventive Care 65 Years and Older, Male Preventive care refers to lifestyle choices and visits with your health care provider that can promote health and wellness. Preventive care visits are also called wellness exams. What can I expect for my preventive care visit? Counseling During your preventive care visit, your health care provider may ask about your: Medical history, including: Past medical problems. Family medical history. History of falls. Current health, including: Emotional well-being. Home life and relationship well-being. Sexual activity. Memory and ability to understand (cognition). Lifestyle, including: Alcohol, nicotine or tobacco, and drug use. Access to firearms. Diet, exercise, and sleep habits. Work and work Astronomer. Sunscreen use. Safety issues such as seatbelt and bike helmet use. Physical exam Your health care provider will check your: Height and weight. These may be used to calculate your BMI (body  mass index). BMI is a measurement that tells if you are at a healthy weight. Waist circumference. This measures the distance around your waistline. This measurement also tells if you are at a healthy weight and may help predict your risk of certain diseases, such as type 2 diabetes and high blood pressure. Heart rate and blood pressure. Body temperature. Skin for abnormal spots. What immunizations do I need?  Vaccines are usually given at various ages, according to a schedule. Your health care provider will recommend vaccines for you based on your age, medical history, and lifestyle or other factors, such as travel or where you work. What tests do I need? Screening Your health care provider may recommend screening tests for certain conditions. This may include: Lipid and cholesterol levels. Diabetes screening. This is done by checking your blood sugar  (glucose) after you have not eaten for a while (fasting). Hepatitis C test. Hepatitis B test. HIV (human immunodeficiency virus) test. STI (sexually transmitted infection) testing, if you are at risk. Lung cancer screening. Colorectal cancer screening. Prostate cancer screening. Abdominal aortic aneurysm (AAA) screening. You may need this if you are a current or former smoker. Talk with your health care provider about your test results, treatment options, and if necessary, the need for more tests. Follow these instructions at home: Eating and drinking  Eat a diet that includes fresh fruits and vegetables, whole grains, lean protein, and low-fat dairy products. Limit your intake of foods with high amounts of sugar, saturated fats, and salt. Take vitamin and mineral supplements as recommended by your health care provider. Do not drink alcohol if your health care provider tells you not to drink. If you drink alcohol: Limit how much you have to 0-2 drinks a day. Know how much alcohol is in your drink. In the [place] one drink equals one 12 oz bottle of beer (355 mL), one 5 oz glass of wine (148 mL), or one 1 oz glass of hard liquor (44 mL). Lifestyle Brush your teeth every morning and night with fluoride  toothpaste. Floss one time each day. Exercise for at least 30 minutes 5 or more days each week. Do not use any products that contain nicotine or tobacco. These products include cigarettes, chewing tobacco, and vaping devices, such as e-cigarettes. If you need help quitting, ask your health care provider. Do not use drugs. If you are sexually active, practice safe sex. Use a condom or other form of protection to prevent STIs. Take aspirin only as told by your health care provider. Make sure that you understand how much to take and what form to take. Work with your health care provider to find out whether it is safe and beneficial for you to take aspirin daily. Ask your health care provider if you  need to take a cholesterol-lowering medicine (statin). Find healthy ways to manage stress, such as: Meditation, yoga, or listening to music. Journaling. Talking to a trusted person. Spending time with friends and family. Safety Always wear your seat belt while driving or riding in a vehicle. Do not drive: If you have been drinking alcohol. Do not ride with someone who has been drinking. When you are tired or distracted. While texting. If you have been using any mind-altering substances or drugs. Wear a helmet and other protective equipment during sports activities. If you have firearms in your house, make sure you follow all gun safety procedures. Minimize exposure to UV radiation to reduce your risk of skin cancer. What's next? Visit your health  care provider once a year for an annual wellness visit. Ask your health care provider how often you should have your eyes and teeth checked. Stay up to date on all vaccines. This information is not intended to replace advice given to you by your health care provider. Make sure you discuss any questions you have with your health care provider. Document Revised: 12/15/2020 Document Reviewed: 12/15/2020 Elsevier Patient Education  2024 [employer].

## 2024-04-14 NOTE — Progress Notes (Unsigned)
 Subjective:  Patient ID: Stephen Conway, male    DOB: [birth year]  Age: 78 y.o. MRN: [id number]  CC:  Chief Complaint  Patient presents with   Annual Exam    No questions or concerns. Patient has a 2 biopsies today on right side of face-cheek area    HPI Stephen Conway presents for Annual Exam  PCP, me Dermatology, Dr. Elnor, actinic keratoses - few bx this am, treatments. Dr. Lloyd prior.  with prior squamous cell carcinoma of face.  Ophthalmology, Dr. Charmayne with open angle glaucoma bilateral Colonoscopy in 2023 with Dr. Aneita.  Has taken omeprazole  daily for Barrett's esophagus. Will establish with new GI.  Podiatry, Dr. Magdalen  Back pain resolved from strain in July. Requests exercises.   Hyperlipidemia: Lipitor 20 mg daily, no new side effects. Recent labs.  Lab Results  Component Value Date   CHOL 186 04/07/2024   HDL 63.90 04/07/2024   LDLCALC 88 04/07/2024   TRIG 172.0 (H) 04/07/2024   CHOLHDL 3 04/07/2024   Lab Results  Component Value Date   ALT 20 04/07/2024   AST 20 04/07/2024   ALKPHOS 71 04/07/2024   BILITOT 1.0 04/07/2024   Insomnia Chronic with primarily use of meds for travel [place].  Rare use of Ambien .  Denies parasomnias or new side effects with Ambien . [place]. Controlled substance database (PDMP) reviewed. No concerns appreciated.  Last filled #15 on 7/30. Need rf. Takes 1/2 dose.   Erectile dysfunction Treated with sildenafil  20mg  up to 3 at a time for erectile dysfunction, denies new headache, flushing, or chest pain with exertion with use.  Denies hearing or vision changes. Still effective.       04/14/2024    2:47 PM 08/22/2023    7:59 AM 04/02/2023    8:50 AM 11/01/2022    8:14 AM 03/30/2022    8:01 AM  Depression screen PHQ 2/9  Decreased Interest 0 0 0 0 0  Down, Depressed, Hopeless 0 0 0 0 0  PHQ - 2 Score 0 0 0 0 0  Altered sleeping 0 1 1 2  0  Tired, decreased energy  0 0 0 0 0  Change in appetite 0 0 0 0 0  Feeling bad or failure about yourself  0 0 0 0 0  Trouble concentrating 0 0 0 0 0  Moving slowly or fidgety/restless 0 0 0 0 0  Suicidal thoughts 0 0 0 0 0  PHQ-9 Score 0 1 1 2  0  Difficult doing work/chores Not difficult at all  Not difficult at all Not difficult at all     Health Maintenance  Topic Date Due   Medicare Annual Wellness (AWV)  11/01/2023   DTaP/Tdap/Td (3 - Td or Tdap) 01/20/2024   COVID-19 Vaccine (4 - 2025-26 season) 04/30/2024 (Originally 03/03/2024)   Colonoscopy  03/08/2025   Pneumococcal Vaccine: 50+ Years  Completed   Influenza Vaccine  Completed   Hepatitis C Screening  Completed   Zoster Vaccines- Shingrix  Completed   Meningococcal B Vaccine  Aged Out  Colonoscopy September 2020 3 repeat in 3 years due to history of polyps. Prostate: does have family history of prostate cancer - brother last year, stage 4 prostate CA. He is doing well.  The natural history of prostate cancer and ongoing controversy regarding screening and potential treatment outcomes of prostate cancer has been discussed with the patient. The meaning of a  false positive PSA and a false negative PSA has been discussed.  Risk of testing were discussed including based on his age.  He has indicated understanding of the limitations of this screening test and has wished to proceed with screening PSA testing.  Recent testing stable. Lab Results  Component Value Date   PSA1 0.5 03/26/2020   PSA1 0.5 03/27/2019   PSA1 0.6 03/07/2018   PSA 0.60 04/07/2024   PSA 0.64 04/02/2023   PSA 0.42 03/30/2022      Immunization History  Administered Date(s) Administered   Fluad Quad(high Dose 65+) 03/27/2019, 03/26/2020, 03/28/2021, 03/30/2022   Fluad Trivalent(High Dose 65+) 04/02/2023   Hepatitis A 02/24/1997, 02/18/2004   Hepatitis A, Adult 07/28/2004   INFLUENZA, HIGH DOSE SEASONAL PF 03/07/2018, 04/14/2024   Influenza,inj,Quad PF,6+ Mos 02/24/2016, 03/01/2017    Influenza-Unspecified 03/28/2014   MMR 02/18/2004   PFIZER(Purple Top)SARS-COV-2 Vaccination 07/24/2019, 08/14/2019, 03/26/2020   Pneumococcal Conjugate-13 01/19/2014   Pneumococcal Polysaccharide-23 08/10/2010   Td 07/03/2002   Tdap 01/19/2014   Zoster Recombinant(Shingrix) 03/28/2019   Zoster, Unspecified 04/20/2021  Had shingrix x2 Flu vaccine today.  Covid booster: option given at pharmacy. RSV vaccine - option of vaccine at his pharmacy.  Updated tdap recommended.   No results found. Recent visit with Dr. Charmayne.   Dental: every 6 months  Alcohol: 2 per day, 5 days per day.   Tobacco: none  Exercise: 7 days per week. Tennis, pickle ball, walking golf course.    History Patient Active Problem List   Diagnosis Date Noted   Strain of lumbar paraspinal muscle 01/30/2024   Jet lag 01/30/2024   Rosacea 02/24/2016   Hyperlipidemia 02/24/2016   Erectile dysfunction 02/03/2013   Lipids abnormal 09/25/2011   History of colonic polyps 09/26/2010   Barrett's esophagus 09/26/2010   Past Medical History:  Diagnosis Date   Adenomatous polyp    Allergy [place]   Arthritis    RIGHT hand   Barrett's esophagus 08/03/2006   Cataract    GERD (gastroesophageal reflux disease)    on meds   Glaucoma Minor   Hyperlipidemia    on meds   Internal hemorrhoids 08/03/2005   Past Surgical History:  Procedure Laterality Date   CATARACT EXTRACTION Bilateral    COLONOSCOPY  2015   MS-MAC-movi(good)-hems/SSP x 2   EYE SURGERY  Cataract   HEMORRHOID SURGERY     KNEE ARTHROSCOPY Left    UPPER GASTROINTESTINAL ENDOSCOPY  2015   barrett's esophagus   VASECTOMY     No Known Allergies Prior to Admission medications   Medication Sig Start Date End Date Taking? Authorizing Provider  atorvastatin  (LIPITOR) 20 MG tablet Take 1 tablet (20 mg total) by mouth daily. 04/02/23  Yes Levora Reyes SAUNDERS, MD  Dermatological Products, Misc. (DERMAREST ROSACEA EX) Apply topically.   Yes [provider]  Glucosamine-Chondroit-Vit C-Mn (GLUCOSAMINE 1500 COMPLEX PO) Take 1 tablet by mouth daily.   Yes [provider]  latanoprost (XALATAN) 0.005 % ophthalmic solution 1 drop at bedtime.   Yes [provider]  Melatonin 1 MG TABS Take 1 tablet by mouth at bedtime.   Yes [provider]  omeprazole  (PRILOSEC) 20 MG capsule Take 1 capsule (20 mg total) by mouth daily. 04/02/23  Yes Levora Reyes SAUNDERS, MD  sildenafil  (REVATIO ) 20 MG tablet Take 1-3 tablets (20-60 mg total) by mouth daily as needed. Take 1-3 tablets (20-60 mg) prior to anticipated intercourse 04/02/23  Yes Levora Reyes SAUNDERS, MD  SYSTANE ULTRA 0.4-0.3 %  SOLN Apply 1 drop to eye daily as needed. 10/21/21  Yes [provider]  timolol (TIMOPTIC) 0.5 % ophthalmic solution Place 1 drop into both eyes in the morning. 01/24/22  Yes [provider]  zolpidem  (AMBIEN ) 5 MG tablet 1 tablet by mouth at night as needed for jet lag 01/30/24  Yes Stephen Cleatus Ned, MD  benzonatate  (TESSALON ) 100 MG capsule Take 1 capsule (100 mg total) by mouth 3 (three) times daily as needed for cough. 08/22/23   Levora Reyes SAUNDERS, MD  fluticasone  (FLONASE ) 50 MCG/ACT nasal spray Place 1 spray into both nostrils daily. 03/26/20   Tish Alm DEL, MD  guaiFENesin -codeine  100-10 MG/5ML syrup Take 5-10 mLs by mouth 3 (three) times daily as needed for cough. 08/22/23   Levora Reyes SAUNDERS, MD  zaleplon (SONATA) 10 MG capsule 10 mg. One tablet by mouth once a week   09/25/11  [provider]   Social History   Socioeconomic History   Marital status: Married    Spouse name: Not on file   Number of children: 2   Years of education: Not on file   Highest education level: Bachelor's degree (e.g., BA, AB, BS)  Occupational History   Occupation: Retired  Tobacco Use   Smoking status: Former    Types: Pipe   Smokeless tobacco: Never   Tobacco comments:    pr stopped in 2020  Vaping Use   Vaping status:  Never Used  Substance and Sexual Activity   Alcohol use: Yes    Alcohol/week: 10.0 standard drinks of alcohol    Types: 8 Glasses of wine, 2 Shots of liquor per week    Comment: couple glasses of wine nightly   Drug use: No   Sexual activity: Yes    Birth control/protection: Surgical  Other Topics Concern   Not on file  Social History Narrative   Not on file   Social Drivers of Health   Financial Resource Strain: Low Risk  (01/29/2024)   Overall Financial Resource Strain (CARDIA)    Difficulty of Paying Living Expenses: Not hard at all  Food Insecurity: No Food Insecurity (04/14/2024)   Hunger Vital Sign    Worried About Running Out of Food in the Last Year: Never true    Ran Out of Food in the Last Year: Never true  Transportation Needs: No Transportation Needs (04/14/2024)   PRAPARE - Administrator, Civil Service (Medical): No    Lack of Transportation (Non-Medical): No  Physical Activity: Sufficiently Active (01/29/2024)   Exercise Vital Sign    Days of Exercise per Week: 6 days    Minutes of Exercise per Session: 90 min  Stress: No Stress Concern Present (01/29/2024)   [employer] of Occupational Health - Occupational Stress Questionnaire    Feeling of Stress: Only a little  Social Connections: Socially Integrated (04/14/2024)   Social Connection and Isolation Panel    Frequency of Communication with Friends and Family: More than three times a week    Frequency of Social Gatherings with Friends and Family: More than three times a week    Attends Religious Services: More than 4 times per year    Active Member of [employer] or Organizations: Yes    Attends Banker Meetings: More than 4 times per year    Marital Status: Married  Catering manager Violence: Not At Risk (04/02/2023)   Humiliation, Afraid, Rape, and Kick questionnaire    Fear of Current or Ex-Partner: No  Emotionally Abused: No    Physically Abused: No    Sexually Abused: No     Review of Systems 13 point review of systems per patient health survey noted.  Negative other than as indicated above or in HPI.    Objective:   Vitals:   04/14/24 1444  BP: 130/88  Pulse: 89  Resp: 16  Temp: 98.1 F (36.7 C)  TempSrc: Temporal  SpO2: 99%  Weight: 205 lb (93 kg)  Height: 5' 9 (1.753 m)   {Vitals History (Optional):23777}  Physical Exam Vitals reviewed.  Constitutional:      Appearance: He is well-developed.  HENT:     Head: Normocephalic and atraumatic.     Right Ear: External ear normal.     Left Ear: External ear normal.  Eyes:     Conjunctiva/sclera: Conjunctivae normal.     Pupils: Pupils are equal, round, and reactive to light.  Neck:     Thyroid : No thyromegaly.  Cardiovascular:     Rate and Rhythm: Normal rate and regular rhythm.     Heart sounds: Normal heart sounds.  Pulmonary:     Effort: Pulmonary effort is normal. No respiratory distress.     Breath sounds: Normal breath sounds. No wheezing.  Abdominal:     General: There is no distension.     Palpations: Abdomen is soft.     Tenderness: There is no abdominal tenderness.  Musculoskeletal:        General: No tenderness. Normal range of motion.     Cervical back: Normal range of motion and neck supple.  Lymphadenopathy:     Cervical: No cervical adenopathy.  Skin:    General: Skin is warm and dry.  Neurological:     Mental Status: He is alert and oriented to person, place, and time.     Deep Tendon Reflexes: Reflexes are normal and symmetric.  Psychiatric:        Behavior: Behavior normal.        Assessment & Plan:  Stephen Conway is a 78 y.o. male . Annual physical exam  Need for influenza vaccination - Plan: Flu vaccine HIGH DOSE PF(Fluzone Trivalent)  Gastroesophageal reflux disease, unspecified whether esophagitis present - Plan: Ambulatory referral to Gastroenterology  Barrett's esophagus without dysplasia - Plan: Ambulatory referral to  Gastroenterology  Sleep disturbance  Hyperlipidemia, unspecified hyperlipidemia type  Screen for colon cancer - Plan: Ambulatory referral to Gastroenterology   No orders of the defined types were placed in this encounter.  Patient Instructions  Thank you for coming in today. No change in medications at this time.  Take care!   Back Exercises The following exercises strengthen the muscles that help to support the trunk (torso) and back. They also help to keep the lower back flexible. Doing these exercises can help to prevent or lessen existing low back pain. If you have back pain or discomfort, try doing these exercises 2-3 times each day or as told by your health care provider. As your pain improves, do them once each day, but increase the number of times that you repeat the steps for each exercise (do more repetitions). To prevent the recurrence of back pain, continue to do these exercises once each day or as told by your health care provider. Do exercises exactly as told by your health care provider and adjust them as directed. It is normal to feel mild stretching, pulling, tightness, or discomfort as you do these exercises, but you should stop right away if  you feel sudden pain or your pain gets worse. Exercises Single knee to chest Repeat these steps 3-5 times for each leg: Lie on your back on a firm bed or the floor with your legs extended. Bring one knee to your chest. Your other leg should stay extended and in contact with the floor. Hold your knee in place by grabbing your knee or thigh with both hands and hold. Pull on your knee until you feel a gentle stretch in your lower back or buttocks. Hold the stretch for 10-30 seconds. Slowly release and straighten your leg.  Pelvic tilt Repeat these steps 5-10 times: Lie on your back on a firm bed or the floor with your legs extended. Bend your knees so they are pointing toward the ceiling and your feet are flat on the  floor. Tighten your lower abdominal muscles to press your lower back against the floor. This motion will tilt your pelvis so your tailbone points up toward the ceiling instead of pointing to your feet or the floor. With gentle tension and even breathing, hold this position for 5-10 seconds.  Cat-cow Repeat these steps until your lower back becomes more flexible: Get into a hands-and-knees position on a firm bed or the floor. Keep your hands under your shoulders, and keep your knees under your hips. You may place padding under your knees for comfort. Let your head hang down toward your chest. Contract your abdominal muscles and point your tailbone toward the floor so your lower back becomes rounded like the back of a cat. Hold this position for 5 seconds. Slowly lift your head, let your abdominal muscles relax, and point your tailbone up toward the ceiling so your back forms a sagging arch like the back of a cow. Hold this position for 5 seconds.  Press-ups Repeat these steps 5-10 times: Lie on your abdomen (face-down) on a firm bed or the floor. Place your palms near your head, about shoulder-width apart. Keeping your back as relaxed as possible and keeping your hips on the floor, slowly straighten your arms to raise the top half of your body and lift your shoulders. Do not use your back muscles to raise your upper torso. You may adjust the placement of your hands to make yourself more comfortable. Hold this position for 5 seconds while you keep your back relaxed. Slowly return to lying flat on the floor.  Bridges Repeat these steps 10 times: Lie on your back on a firm bed or the floor. Bend your knees so they are pointing toward the ceiling and your feet are flat on the floor. Your arms should be flat at your sides, next to your body. Tighten your buttocks muscles and lift your buttocks off the floor until your waist is at almost the same height as your knees. You should feel the muscles  working in your buttocks and the back of your thighs. If you do not feel these muscles, slide your feet 1-2 inches (2.5-5 cm) farther away from your buttocks. Hold this position for 3-5 seconds. Slowly lower your hips to the starting position, and allow your buttocks muscles to relax completely. If this exercise is too easy, try doing it with your arms crossed over your chest. Abdominal crunches Repeat these steps 5-10 times: Lie on your back on a firm bed or the floor with your legs extended. Bend your knees so they are pointing toward the ceiling and your feet are flat on the floor. Cross your arms over your chest.  Tip your chin slightly toward your chest without bending your neck. Tighten your abdominal muscles and slowly raise your torso high enough to lift your shoulder blades a tiny bit off the floor. Avoid raising your torso higher than that because it can put too much stress on your lower back and does not help to strengthen your abdominal muscles. Slowly return to your starting position.  Back lifts Repeat these steps 5-10 times: Lie on your abdomen (face-down) with your arms at your sides, and rest your forehead on the floor. Tighten the muscles in your legs and your buttocks. Slowly lift your chest off the floor while you keep your hips pressed to the floor. Keep the back of your head in line with the curve in your back. Your eyes should be looking at the floor. Hold this position for 3-5 seconds. Slowly return to your starting position.  Contact a health care provider if: Your back pain or discomfort gets much worse when you do an exercise. Your worsening back pain or discomfort does not lessen within 2 hours after you exercise. If you have any of these problems, stop doing these exercises right away. Do not do them again unless your health care provider says that you can. Get help right away if: You develop sudden, severe back pain. If this happens, stop doing the exercises  right away. Do not do them again unless your health care provider says that you can. This information is not intended to replace advice given to you by your health care provider. Make sure you discuss any questions you have with your health care provider. Document Revised: 07/23/2022 Document Reviewed: 09/01/2020 Elsevier Patient Education  2024 [employer].    Preventive Care 65 Years and Older, Male Preventive care refers to lifestyle choices and visits with your health care provider that can promote health and wellness. Preventive care visits are also called wellness exams. What can I expect for my preventive care visit? Counseling During your preventive care visit, your health care provider may ask about your: Medical history, including: Past medical problems. Family medical history. History of falls. Current health, including: Emotional well-being. Home life and relationship well-being. Sexual activity. Memory and ability to understand (cognition). Lifestyle, including: Alcohol, nicotine or tobacco, and drug use. Access to firearms. Diet, exercise, and sleep habits. Work and work Astronomer. Sunscreen use. Safety issues such as seatbelt and bike helmet use. Physical exam Your health care provider will check your: Height and weight. These may be used to calculate your BMI (body mass index). BMI is a measurement that tells if you are at a healthy weight. Waist circumference. This measures the distance around your waistline. This measurement also tells if you are at a healthy weight and may help predict your risk of certain diseases, such as type 2 diabetes and high blood pressure. Heart rate and blood pressure. Body temperature. Skin for abnormal spots. What immunizations do I need?  Vaccines are usually given at various ages, according to a schedule. Your health care provider will recommend vaccines for you based on your age, medical history, and lifestyle or other  factors, such as travel or where you work. What tests do I need? Screening Your health care provider may recommend screening tests for certain conditions. This may include: Lipid and cholesterol levels. Diabetes screening. This is done by checking your blood sugar (glucose) after you have not eaten for a while (fasting). Hepatitis C test. Hepatitis B test. HIV (human immunodeficiency virus) test. STI (sexually transmitted  infection) testing, if you are at risk. Lung cancer screening. Colorectal cancer screening. Prostate cancer screening. Abdominal aortic aneurysm (AAA) screening. You may need this if you are a current or former smoker. Talk with your health care provider about your test results, treatment options, and if necessary, the need for more tests. Follow these instructions at home: Eating and drinking  Eat a diet that includes fresh fruits and vegetables, whole grains, lean protein, and low-fat dairy products. Limit your intake of foods with high amounts of sugar, saturated fats, and salt. Take vitamin and mineral supplements as recommended by your health care provider. Do not drink alcohol if your health care provider tells you not to drink. If you drink alcohol: Limit how much you have to 0-2 drinks a day. Know how much alcohol is in your drink. In the [place] one drink equals one 12 oz bottle of beer (355 mL), one 5 oz glass of wine (148 mL), or one 1 oz glass of hard liquor (44 mL). Lifestyle Brush your teeth every morning and night with fluoride  toothpaste. Floss one time each day. Exercise for at least 30 minutes 5 or more days each week. Do not use any products that contain nicotine or tobacco. These products include cigarettes, chewing tobacco, and vaping devices, such as e-cigarettes. If you need help quitting, ask your health care provider. Do not use drugs. If you are sexually active, practice safe sex. Use a condom or other form of protection to prevent STIs. Take  aspirin only as told by your health care provider. Make sure that you understand how much to take and what form to take. Work with your health care provider to find out whether it is safe and beneficial for you to take aspirin daily. Ask your health care provider if you need to take a cholesterol-lowering medicine (statin). Find healthy ways to manage stress, such as: Meditation, yoga, or listening to music. Journaling. Talking to a trusted person. Spending time with friends and family. Safety Always wear your seat belt while driving or riding in a vehicle. Do not drive: If you have been drinking alcohol. Do not ride with someone who has been drinking. When you are tired or distracted. While texting. If you have been using any mind-altering substances or drugs. Wear a helmet and other protective equipment during sports activities. If you have firearms in your house, make sure you follow all gun safety procedures. Minimize exposure to UV radiation to reduce your risk of skin cancer. What's next? Visit your health care provider once a year for an annual wellness visit. Ask your health care provider how often you should have your eyes and teeth checked. Stay up to date on all vaccines. This information is not intended to replace advice given to you by your health care provider. Make sure you discuss any questions you have with your health care provider. Document Revised: 12/15/2020 Document Reviewed: 12/15/2020 Elsevier Patient Education  2024 Elsevier Inc.     Signed,   Reyes Pines, MD [facility], [facility] 04/14/24 3:50 PM

## 2024-04-15 ENCOUNTER — Other Ambulatory Visit: Payer: Self-pay

## 2024-04-15 DIAGNOSIS — K2270 Barrett's esophagus without dysplasia: Secondary | ICD-10-CM

## 2024-04-15 DIAGNOSIS — E785 Hyperlipidemia, unspecified: Secondary | ICD-10-CM

## 2024-04-15 MED ORDER — SILDENAFIL CITRATE 20 MG PO TABS
20.0000 mg | ORAL_TABLET | Freq: Every day | ORAL | 5 refills | Status: AC | PRN
Start: 2024-04-15 — End: ?

## 2024-04-15 MED ORDER — ATORVASTATIN CALCIUM 20 MG PO TABS: 20.0000 mg | ORAL_TABLET | Freq: Every day | ORAL | 1 refills | Status: AC

## 2024-04-15 MED ORDER — OMEPRAZOLE 20 MG PO CPDR
20.0000 mg | DELAYED_RELEASE_CAPSULE | Freq: Every day | ORAL | 3 refills | Status: AC
Start: 2024-04-15 — End: ?

## 2024-04-15 MED ORDER — ZOLPIDEM TARTRATE 5 MG PO TABS: ORAL_TABLET | ORAL | 0 refills | Status: AC

## 2024-04-30 DIAGNOSIS — D0439 Carcinoma in situ of skin of other parts of face: Secondary | ICD-10-CM | POA: Diagnosis not present

## 2025-04-20 ENCOUNTER — Encounter: Admitting: Family Medicine
